# Patient Record
Sex: Male | Born: 2000 | Race: White | Hispanic: No | Marital: Single | Smoking: Never smoker
Health system: Southern US, Community
[De-identification: ages and names within clinical notes are randomized; demographics above are authoritative.]

## PROBLEM LIST (undated history)

## (undated) DIAGNOSIS — Z8489 Family history of other specified conditions: Secondary | ICD-10-CM

## (undated) DIAGNOSIS — E785 Hyperlipidemia, unspecified: Secondary | ICD-10-CM

## (undated) DIAGNOSIS — E119 Type 2 diabetes mellitus without complications: Secondary | ICD-10-CM

## (undated) DIAGNOSIS — E669 Obesity, unspecified: Secondary | ICD-10-CM

## (undated) DIAGNOSIS — R748 Abnormal levels of other serum enzymes: Secondary | ICD-10-CM

## (undated) DIAGNOSIS — I10 Essential (primary) hypertension: Secondary | ICD-10-CM

## (undated) DIAGNOSIS — D649 Anemia, unspecified: Secondary | ICD-10-CM

## (undated) HISTORY — PX: NO PAST SURGERIES: SHX2092

---
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
## 2019-10-08 ENCOUNTER — Other Ambulatory Visit: Payer: Managed Care, Other (non HMO)

## 2021-01-03 ENCOUNTER — Emergency Department: Payer: Self-pay

## 2021-01-03 ENCOUNTER — Emergency Department
Admission: EM | Admit: 2021-01-03 | Discharge: 2021-01-03 | Disposition: A | Discharge status: Home / Self Care | Payer: Self-pay | Attending: Emergency Medicine | Admitting: Emergency Medicine

## 2021-01-03 ENCOUNTER — Other Ambulatory Visit: Payer: Self-pay

## 2021-01-03 DIAGNOSIS — W010XXA Fall on same level from slipping, tripping and stumbling without subsequent striking against object, initial encounter: Secondary | ICD-10-CM | POA: Diagnosis not present

## 2021-01-03 DIAGNOSIS — Y9301 Activity, walking, marching and hiking: Secondary | ICD-10-CM | POA: Diagnosis not present

## 2021-01-03 DIAGNOSIS — M25562 Pain in left knee: Secondary | ICD-10-CM | POA: Insufficient documentation

## 2021-01-03 DIAGNOSIS — Y9289 Other specified places as the place of occurrence of the external cause: Secondary | ICD-10-CM | POA: Diagnosis not present

## 2021-01-03 DIAGNOSIS — I10 Essential (primary) hypertension: Secondary | ICD-10-CM | POA: Insufficient documentation

## 2021-01-03 HISTORY — DX: Essential (primary) hypertension: I10

## 2021-01-03 MED ORDER — KETOROLAC TROMETHAMINE 30 MG/ML IJ SOLN
30.0000 mg | Freq: Once | INTRAMUSCULAR | Status: AC
  Administered 2021-01-03: 30 mg via INTRAMUSCULAR
  Filled 2021-01-03: qty 1

## 2021-01-03 MED ORDER — MELOXICAM 15 MG PO TABS: 15.0000 mg | ORAL_TABLET | Freq: Every day | ORAL | 2 refills | Status: DC

## 2021-01-03 NOTE — ED Triage Notes (Signed)
Pt states he was walking into [employer] and slipped and fell, pt states he felt like his knee popped out but he was able to pop it back in. Pt states he is now having pain, able to ambulate.

## 2021-01-03 NOTE — ED Notes (Signed)
Patient not in room

## 2021-01-03 NOTE — ED Notes (Addendum)
Patient denies pain or swelling at injection site. Patient reports mild improvement to pain. Patient assisted to father's POV via wheelchair by this RN.

## 2021-01-03 NOTE — ED Notes (Signed)
Patient reports talking to a restaurant earlier this evening, tripping, and falling onto his knees. Patient reports "all my weight went onto my left knee, and it popped out. I have had this happen so many times before, I popped it back in before EMS even got there." The patient reports "It's so painful, I can't stand it and I can't put weight on it." Patient provided ice pack by this RN.

## 2021-01-03 NOTE — Discharge Instructions (Signed)
Take Meloxicam once daily for pain and inflammation.  

## 2021-01-03 NOTE — ED Provider Notes (Signed)
[facility]-EMERGENCY DEPARTMENT  ____________________________________________  Time seen: Approximately 9:30 PM  I have reviewed the triage vital signs and the nursing notes.   HISTORY  Chief Complaint Knee Pain   Historian Patient     HPI Todd Ward is a 20 y.o. male presents to the emergency department after his left patella subluxed during a fall.  Patient states that he was able to reduce patella easily.  He states that he is experience subluxed patella in the past and has been coached by physical therapy on how to undergo reduction.  He states that he has had some left knee discomfort but has improved since reduction.  He denies numbness or tingling in the lower extremities.  No abrasions or lacerations.  No other alleviating measures have been attempted.   Past Medical History:  Diagnosis Date  . Family history of adverse reaction to anesthesia    PTS TWIN BROTHER-HARD TIME TO WAKE UP  . Hypertension   . Obesity      Immunizations up to date:  Yes.     Past Medical History:  Diagnosis Date  . Family history of adverse reaction to anesthesia    PTS TWIN BROTHER-HARD TIME TO WAKE UP  . Hypertension   . Obesity     There are no problems to display for this patient.   Past Surgical History:  Procedure Laterality Date  . KNEE ARTHROSCOPY Left 02/16/2016   Procedure: ARTHROSCOPY KNEE, LATERAL RELEASE, LOOSE BODY REMOVAL;  Surgeon: Kennedy Bucker, MD;  Location: [facility] ORS;  Service: Orthopedics;  Laterality: Left;  . NO PAST SURGERIES      Prior to Admission medications   Medication Sig Start Date End Date Taking? Authorizing Provider  meloxicam (MOBIC) 15 MG tablet Take 1 tablet (15 mg total) by mouth daily. 01/03/21 01/03/22 Yes Pia Mau M, PA-C  HYDROcodone-acetaminophen (NORCO) 5-325 MG tablet Take 1 tablet by mouth every 6 (six) hours as needed for moderate pain. 02/16/16   Kennedy Bucker, MD  ibuprofen (ADVIL,MOTRIN) 600 MG tablet Take 600 mg by mouth every  6 (six) hours as needed.    [provider]    Allergies Patient has no known allergies.  No family history on file.  Social History Social History   Tobacco Use  . Smoking status: Never Smoker  . Smokeless tobacco: Never Used  Substance Use Topics  . Alcohol use: No  . Drug use: No     Review of Systems  Constitutional: No fever/chills Eyes:  No discharge ENT: No upper respiratory complaints. Respiratory: no cough. No SOB/ use of accessory muscles to breath Gastrointestinal:   No nausea, no vomiting.  No diarrhea.  No constipation. Musculoskeletal: Patient has left knee pain.  Skin: Negative for rash, abrasions, lacerations, ecchymosis.    ____________________________________________   PHYSICAL EXAM:  VITAL SIGNS: ED Triage Vitals  Enc Vitals Group     BP 01/03/21 2021 134/81     Pulse Rate 01/03/21 2021 98     Resp 01/03/21 2021 20     Temp 01/03/21 2021 98.7 F (37.1 C)     Temp src --      SpO2 01/03/21 2021 97 %     Weight 01/03/21 2019 (!) 490 lb (222.3 kg)     Height 01/03/21 2019 6\' 3"  (1.905 m)     Head Circumference --      Peak Flow --      Pain Score 01/03/21 2019 8     Pain Loc --  Pain Edu? --      Excl. in GC? --      Constitutional: Alert and oriented. Well appearing and in no acute distress. Eyes: Conjunctivae are normal. PERRL. EOMI. Head: Atraumatic. ENT:  Cardiovascular: Normal rate, regular rhythm. Normal S1 and S2.  Good peripheral circulation. Respiratory: Normal respiratory effort without tachypnea or retractions. Lungs CTAB. Good air entry to the bases with no decreased or absent breath sounds Gastrointestinal: Bowel sounds x 4 quadrants. Soft and nontender to palpation. No guarding or rigidity. No distention. Musculoskeletal: Full range of motion to all extremities. No obvious deformities noted Neurologic:  Normal for age. No gross focal neurologic deficits are appreciated.  Skin:  Skin is warm, dry and intact.  No rash noted. Psychiatric: Mood and affect are normal for age. Speech and behavior are normal.   ____________________________________________   LABS (all labs ordered are listed, but only abnormal results are displayed)  Labs Reviewed - No data to display ____________________________________________  EKG   ____________________________________________  RADIOLOGY Geraldo Pitter, personally viewed and evaluated these images (plain radiographs) as part of my medical decision making, as well as reviewing the written report by the radiologist.  DG Knee Complete 4 Views Left  Result Date: 01/03/2021 CLINICAL DATA:  Slipped and fell, transient patellar dislocation EXAM: LEFT KNEE - COMPLETE 4+ VIEW COMPARISON:  None. FINDINGS: Frontal, bilateral oblique, lateral views of the left knee are obtained. There is lateral subluxation of the patella without frank dislocation. Moderate joint effusion. No acute fracture, subluxation, or dislocation. Mild medial and lateral compartmental osteoarthritis. Soft tissues are otherwise unremarkable. IMPRESSION: 1. Lateral subluxation of the patella without frank dislocation. 2. Moderate joint effusion. 3. Mild osteoarthritis. 4. No acute fracture. Electronically Signed   By: Sharlet Salina M.D.   On: 01/03/2021 20:53    ____________________________________________    PROCEDURES  Procedure(s) performed:     Procedures     Medications  ketorolac (TORADOL) 30 MG/ML injection 30 mg (has no administration in time range)     ____________________________________________   INITIAL IMPRESSION / ASSESSMENT AND PLAN / ED COURSE  Pertinent labs & imaging results that were available during my care of the patient were reviewed by me and considered in my medical decision making (see chart for details).      Assessment and plan Knee pain:  20 year old male presents to the emergency department with acute left knee pain after mechanical fall that  occurred earlier in the day.  X-ray of the left knee showed a subluxed patella without dislocation.  Patient was advised to follow-up with orthopedics.  He was given an injection of Toradol for pain and was discharged with meloxicam.     ____________________________________________  FINAL CLINICAL IMPRESSION(S) / ED DIAGNOSES  Final diagnoses:  Acute pain of left knee      NEW MEDICATIONS STARTED DURING THIS VISIT:  ED Discharge Orders         Ordered    meloxicam (MOBIC) 15 MG tablet  Daily        01/03/21 2113              This chart was dictated using voice recognition software/Dragon. Despite best efforts to proofread, errors can occur which can change the meaning. Any change was purely unintentional.     Gasper Lloyd 01/03/21 2136    Minna Antis, MD 01/03/21 [phone]

## 2021-01-03 NOTE — ED Notes (Signed)
Patient transported to X-ray 

## 2021-01-17 ENCOUNTER — Other Ambulatory Visit: Payer: Self-pay | Admitting: Orthopedic Surgery

## 2021-01-24 ENCOUNTER — Encounter: Payer: Self-pay | Admitting: Orthopedic Surgery

## 2021-01-24 ENCOUNTER — Encounter
Admission: RE | Admit: 2021-01-24 | Discharge: 2021-01-24 | Disposition: A | Discharge status: Home / Self Care | Payer: No Typology Code available for payment source | Source: Ambulatory Visit | Attending: Orthopedic Surgery | Admitting: Orthopedic Surgery

## 2021-01-24 ENCOUNTER — Other Ambulatory Visit: Payer: Self-pay

## 2021-01-24 DIAGNOSIS — E119 Type 2 diabetes mellitus without complications: Secondary | ICD-10-CM | POA: Diagnosis not present

## 2021-01-24 DIAGNOSIS — I10 Essential (primary) hypertension: Secondary | ICD-10-CM | POA: Diagnosis not present

## 2021-01-24 DIAGNOSIS — Z01818 Encounter for other preprocedural examination: Secondary | ICD-10-CM | POA: Insufficient documentation

## 2021-01-24 DIAGNOSIS — U071 COVID-19: Secondary | ICD-10-CM | POA: Insufficient documentation

## 2021-01-24 HISTORY — DX: Anemia, unspecified: D64.9

## 2021-01-24 HISTORY — DX: Abnormal levels of other serum enzymes: R74.8

## 2021-01-24 HISTORY — DX: Morbid (severe) obesity due to excess calories: E66.01

## 2021-01-24 HISTORY — DX: Hyperlipidemia, unspecified: E78.5

## 2021-01-24 NOTE — Patient Instructions (Addendum)
Your procedure is scheduled on:01-29-21 MONDAY Report to the Registration Desk on the 1st floor of the [facility]-Then proceed to the 2nd floor Surgery Desk in the [facility] To find out your arrival time, please call [phone] between 1PM - 3PM on:01-26-21 FRIDAY  REMEMBER: Instructions that are not followed completely may result in serious medical risk, up to and including death; or upon the discretion of your surgeon and anesthesiologist your surgery may need to be rescheduled.  Do not eat food after midnight the night before surgery.  No gum chewing, lozengers or hard candies.  You may however, drink CLEAR liquids up to 2 hours before you are scheduled to arrive for your surgery. Do not drink anything within 2 hours of your scheduled arrival time.  Clear liquids include: - water  - apple juice without pulp - gatorade - black coffee or tea (Do NOT add milk or creamers to the coffee or tea) Do NOT drink anything that is not on this list.  In addition, your doctor has ordered for you to drink the provided  Ensure Pre-Surgery Clear Carbohydrate Drink  Drinking this carbohydrate drink up to two hours before surgery helps to reduce insulin resistance and improve patient outcomes. Please complete drinking 2 hours prior to scheduled arrival time.  TAKE THESE MEDICATIONS THE MORNING OF SURGERY WITH A SIP OF WATER: -YOU MAY TAKE TRAMADOL (ULTRAM) THE DAY OF SURGERY IF NEEDED  One week prior to surgery: Stop Anti-inflammatories (NSAIDS) such as MOBIC (MELOXICAM), Advil, Aleve, Ibuprofen, Motrin, Naproxen, Naprosyn and Aspirin based products such as Excedrin, Goodys Powder, BC Powder-OK TO TAKE TYLENOL/TRAMADOL IF NEEDED  Stop ANY OVER THE COUNTER supplements until after surgery.  No Alcohol for 24 hours before or after surgery.  No Smoking including e-cigarettes for 24 hours prior to surgery.  No chewable tobacco products for at least 6 hours prior to surgery.  No nicotine patches  on the day of surgery.  Do not use any "recreational" drugs for at least a week prior to your surgery.  Please be advised that the combination of cocaine and anesthesia may have negative outcomes, up to and including death. If you test positive for cocaine, your surgery will be cancelled.  On the morning of surgery brush your teeth with toothpaste and water, you may rinse your mouth with mouthwash if you wish. Do not swallow any toothpaste or mouthwash.  Do not wear jewelry, make-up, hairpins, clips or nail polish.  Do not wear lotions, powders, or perfumes.   Do not shave body from the neck down 48 hours prior to surgery just in case you cut yourself which could leave a site for infection.  Also, freshly shaved skin may become irritated if using the CHG soap.  Contact lenses, hearing aids and dentures may not be worn into surgery.  Do not bring valuables to the hospital. [facility] is not responsible for any missing/lost belongings or valuables.   Use CHG Soap as directed on instruction sheet.  Notify your doctor if there is any change in your medical condition (cold, fever, infection).  Wear comfortable clothing (specific to your surgery type) to the hospital.  Plan for stool softeners for home use; pain medications have a tendency to cause constipation. You can also help prevent constipation by eating foods high in fiber such as fruits and vegetables and drinking plenty of fluids as your diet allows.  After surgery, you can help prevent lung complications by doing breathing exercises.  Take deep breaths  and cough every 1-2 hours. Your doctor may order a device called an Incentive Spirometer to help you take deep breaths. When coughing or sneezing, hold a pillow firmly against your incision with both hands. This is called "splinting." Doing this helps protect your incision. It also decreases belly discomfort.  If you are being admitted to the hospital overnight, leave your  suitcase in the car. After surgery it may be brought to your room.  If you are being discharged the day of surgery, you will not be allowed to drive home. You will need a responsible adult (18 years or older) to drive you home and stay with you that night.   If you are taking public transportation, you will need to have a responsible adult (18 years or older) with you. Please confirm with your physician that it is acceptable to use public transportation.   Please call the Pre-admissions Testing Dept. at [phone] if you have any questions about these instructions.  Surgery Visitation Policy:  Patients undergoing a surgery or procedure may have one family member or support person with them as long as that person is not COVID-19 positive or experiencing its symptoms.  That person may remain in the waiting area during the procedure.  Inpatient Visitation:    Visiting hours are 7 a.m. to 8 p.m. Inpatients will be allowed two visitors daily. The visitors may change each day during the patient's stay. No visitors under the age of 75. Any visitor under the age of 16 must be accompanied by an adult. The visitor must pass COVID-19 screenings, use hand sanitizer when entering and exiting the patient's room and wear a mask at all times, including in the patient's room. Patients must also wear a mask when staff or their visitor are in the room. Masking is required regardless of vaccination status.

## 2021-01-26 ENCOUNTER — Other Ambulatory Visit: Payer: Self-pay

## 2021-01-26 ENCOUNTER — Other Ambulatory Visit: Payer: No Typology Code available for payment source

## 2021-01-26 ENCOUNTER — Encounter
Admission: RE | Admit: 2021-01-26 | Discharge: 2021-01-26 | Disposition: A | Discharge status: Home / Self Care | Payer: No Typology Code available for payment source | Source: Ambulatory Visit | Attending: Orthopedic Surgery | Admitting: Orthopedic Surgery

## 2021-01-26 DIAGNOSIS — Z01818 Encounter for other preprocedural examination: Secondary | ICD-10-CM | POA: Insufficient documentation

## 2021-01-26 DIAGNOSIS — U071 COVID-19: Secondary | ICD-10-CM | POA: Insufficient documentation

## 2021-01-26 DIAGNOSIS — E119 Type 2 diabetes mellitus without complications: Secondary | ICD-10-CM | POA: Insufficient documentation

## 2021-01-26 DIAGNOSIS — I10 Essential (primary) hypertension: Secondary | ICD-10-CM | POA: Insufficient documentation

## 2021-01-26 LAB — COMPREHENSIVE METABOLIC PANEL
ALT: 84 U/L — ABNORMAL HIGH (ref 0–44)
AST: 53 U/L — ABNORMAL HIGH (ref 15–41)
Albumin: 4.4 g/dL (ref 3.5–5.0)
Alkaline Phosphatase: 98 U/L (ref 38–126)
Anion gap: 12 (ref 5–15)
BUN: 11 mg/dL (ref 6–20)
CO2: 21 mmol/L — ABNORMAL LOW (ref 22–32)
Calcium: 9.5 mg/dL (ref 8.9–10.3)
Chloride: 106 mmol/L (ref 98–111)
Creatinine, Ser: 0.8 mg/dL (ref 0.61–1.24)
GFR, Estimated: >60 mL/min (ref >60)
Glucose, Bld: 96 mg/dL (ref 70–99)
Potassium: 3.9 mmol/L (ref 3.5–5.1)
Sodium: 139 mmol/L (ref 135–145)
Total Bilirubin: 0.8 mg/dL (ref 0.3–1.2)
Total Protein: 7.8 g/dL (ref 6.5–8.1)

## 2021-01-26 LAB — CBC
HCT: 45.4 % (ref 39.0–52.0)
Hemoglobin: 15.4 g/dL (ref 13.0–17.0)
MCH: 27.5 pg (ref 26.0–34.0)
MCHC: 33.9 g/dL (ref 30.0–36.0)
MCV: 80.9 fL (ref 80.0–100.0)
Platelets: 240 10*3/uL (ref 150–400)
RBC: 5.61 MIL/uL (ref 4.22–5.81)
RDW: 13.3 % (ref 11.5–15.5)
WBC: 6.8 10*3/uL (ref 4.0–10.5)
nRBC: 0 % (ref 0.0–0.2)

## 2021-01-27 LAB — SARS CORONAVIRUS 2 (TAT 6-24 HRS): SARS Coronavirus 2: POSITIVE — AB

## 2021-01-28 ENCOUNTER — Telehealth: Payer: Self-pay | Admitting: Physician Assistant

## 2021-01-28 NOTE — Telephone Encounter (Signed)
Called to discuss with Lynnae January about Covid symptoms and the use of bebtelivomab, remdisivir or oral therapies for those with mild to moderate Covid symptoms and at a high risk of hospitalization.     Pt does not qualify as pt has asymptomatic infection. Isolation precautions discussed. Advised to contact back for consideration should they develop symptoms. Patient verbalized understanding. Pts RFs are DM and morbid obesity. He was tested for screening for a knee surgery. He has been vaccinated and boosted. He will call our hotline if he goes on to develop symptoms.     Cline Crock PA-C

## 2021-01-29 ENCOUNTER — Other Ambulatory Visit: Payer: Self-pay

## 2021-01-29 ENCOUNTER — Other Ambulatory Visit: Payer: Self-pay | Admitting: Adult Health

## 2021-01-29 ENCOUNTER — Telehealth: Payer: Self-pay

## 2021-01-29 DIAGNOSIS — U071 COVID-19: Secondary | ICD-10-CM

## 2021-01-29 MED ORDER — ALBUTEROL SULFATE HFA 108 (90 BASE) MCG/ACT IN AERS
2.0000 | INHALATION_SPRAY | Freq: Four times a day (QID) | RESPIRATORY_TRACT | 0 refills | Status: DC | PRN
  Filled 2021-01-29: qty 8.5, 25d supply, fill #0

## 2021-01-29 MED ORDER — NIRMATRELVIR/RITONAVIR (PAXLOVID)TABLET
3.0000 | ORAL_TABLET | Freq: Two times a day (BID) | ORAL | 0 refills | Status: AC
  Filled 2021-01-29: qty 30, 5d supply, fill #0

## 2021-01-29 NOTE — Progress Notes (Signed)
Outpatient Oral COVID Treatment Note  I connected with Todd Ward on 01/29/2021/3:36 PM by telephone and verified that I am speaking with the correct person using two identifiers.  I discussed the limitations, risks, security, and privacy concerns of performing an evaluation and management service by telephone and the availability of in person appointments. I also discussed with the patient that there may be a patient responsible charge related to this service. The patient expressed understanding and agreed to proceed.  Patient location: home Provider location: home Others participating in call: none  Diagnosis: COVID-19 infection  Purpose of visit: Discussion of potential use of Molnupiravir or Paxlovid, a new treatment for mild to moderate COVID-19 viral infection in non-hospitalized patients.   Subjective: Patient is a 20 y.o. male who has been diagnosed with COVID 19 viral infection.  Their symptoms began on 01/27/21 with sore throat.    Past Medical History:  Diagnosis Date  . Diabetes mellitus without complication (HCC)    No meds-Last A1C in 2020 was 5.4  . Elevated liver enzymes   . Family history of adverse reaction to anesthesia    PTS TWIN BROTHER-HARD TIME TO WAKE UP  . Hyperlipidemia   . Hypertension    off meds x 3-4 years  . Morbid obesity (HCC)   . Normocytic normochromic anemia   . Obesity     No Known Allergies   Current Outpatient Medications:  .  meloxicam (MOBIC) 15 MG tablet, Take 1 tablet (15 mg total) by mouth daily., Disp: 30 tablet, Rfl: 2 .  traMADol (ULTRAM) 50 MG tablet, Take 50 mg by mouth every morning., Disp: , Rfl:   Objective: Patient appears/sounds tired and ill, he was just waking up when I spoke with him.  They are in no apparent distress.  Breathing is non labored.  Mood and behavior are normal.  Laboratory Data:  Recent Results (from the past 2160 hour(s))  Comprehensive metabolic panel     Status: Abnormal   Collection Time: 01/26/21   1:21 PM  Result Value Ref Range   Sodium 139 135 - 145 mmol/L   Potassium 3.9 3.5 - 5.1 mmol/L   Chloride 106 98 - 111 mmol/L   CO2 21 (L) 22 - 32 mmol/L   Glucose, Bld 96 70 - 99 mg/dL    Comment: Glucose reference range applies only to samples taken after fasting for at least 8 hours.   BUN 11 6 - 20 mg/dL   Creatinine, Ser 8.67 0.61 - 1.24 mg/dL   Calcium 9.5 8.9 - 61.9 mg/dL   Total Protein 7.8 6.5 - 8.1 g/dL   Albumin 4.4 3.5 - 5.0 g/dL   AST 53 (H) 15 - 41 U/L   ALT 84 (H) 0 - 44 U/L   Alkaline Phosphatase 98 38 - 126 U/L   Total Bilirubin 0.8 0.3 - 1.2 mg/dL   GFR, Estimated >50 >93 mL/min    Comment: (NOTE) Calculated using the CKD-EPI Creatinine Equation (2021)    Anion gap 12 5 - 15    Comment: Performed at [facility], [address] [place], [place] [address]  CBC     Status: None   Collection Time: 01/26/21  1:21 PM  Result Value Ref Range   WBC 6.8 4.0 - 10.5 K/uL   RBC 5.61 4.22 - 5.81 MIL/uL   Hemoglobin 15.4 13.0 - 17.0 g/dL   HCT 45.8 09.9 - 83.3 %   MCV 80.9 80.0 - 100.0 fL   MCH  27.5 26.0 - 34.0 pg   MCHC 33.9 30.0 - 36.0 g/dL   RDW 32.3 55.7 - 32.2 %   Platelets 240 150 - 400 K/uL   nRBC 0.0 0.0 - 0.2 %    Comment: Performed at [facility], [address] [place], [place] [address]  SARS CORONAVIRUS 2 (TAT 6-24 HRS) Nasopharyngeal Nasopharyngeal Swab     Status: Abnormal   Collection Time: 01/26/21  1:53 PM   Specimen: Nasopharyngeal Swab  Result Value Ref Range   SARS Coronavirus 2 POSITIVE (A) NEGATIVE    Comment: (NOTE) SARS-CoV-2 target nucleic acids are DETECTED.  The SARS-CoV-2 RNA is generally detectable in upper and lower respiratory specimens during the acute phase of infection. Positive results are indicative of the presence of SARS-CoV-2 RNA. Clinical correlation with patient history and other diagnostic information is  necessary to determine patient infection status. Positive results do not rule out bacterial  infection or co-infection with other viruses.  The expected result is Negative.  Fact Sheet for Patients: HairSlick.no  Fact Sheet for Healthcare Providers: quierodirigir.com  This test is not yet approved or cleared by the [place] FDA and  has been authorized for detection and/or diagnosis of SARS-CoV-2 by FDA under an Emergency Use Authorization (EUA). This EUA will remain  in effect (meaning this test can be used) for the duration of the COVID-19 declaration under Section 564(b)(1) of the Act, 21 U. S.C. section 360bbb-3(b)(1), unless the authorization is terminated or revoked sooner.   Performed at [facility], [address], [place], [place] [address]      Assessment: 19 y.o. male with mild/moderate COVID 19 viral infection diagnosed on 01/26/21 at high risk for progression to severe COVID 19.  Plan:  This patient is a 20 y.o. male that meets the following criteria for Emergency Use Authorization of: Paxlovid 1. Age >12 yr AND > 40 kg 2. SARS-COV-2 positive test 3. Symptom onset < 5 days 4. Mild-to-moderate COVID disease with high risk for severe progression to hospitalization or death  I have spoken and communicated the following to the patient or parent/caregiver regarding: 1. Paxlovid is an unapproved drug that is authorized for use under an Emergency Use Authorization.  2. There are no adequate, approved, available products for the treatment of COVID-19 in adults who have mild-to-moderate COVID-19 and are at high risk for progressing to severe COVID-19, including hospitalization or death. 3. Other therapeutics are currently authorized. For additional information on all products authorized for treatment or prevention of COVID-19, please see https://www.graham-miller.com/.  4. There are benefits and risks of taking  this treatment as outlined in the "Fact Sheet for Patients and Caregivers."  5. "Fact Sheet for Patients and Caregivers" was reviewed with patient. A hard copy will be provided to patient from pharmacy prior to the patient receiving treatment. 6. Patients should continue to self-isolate and use infection control measures (e.g., wear mask, isolate, social distance, avoid sharing personal items, clean and disinfect "high touch" surfaces, and frequent handwashing) according to CDC guidelines.  7. The patient or parent/caregiver has the option to accept or refuse treatment. 8. Patient medication history was reviewed for potential drug interactions:Interaction with home meds: Tramadol--I recommended that he hold tramadol while taking the paxlovid, since the paxlovid can intensify its effect 9. Patient's GFR was calculated to be 145, and they were therefore prescribed Normal dose (GFR>60) - nirmatrelvir 150mg  tab (2 tablet) by mouth twice daily AND ritonavir 100mg  tab (1 tablet) by mouth  twice daily   After reviewing above information with the patient, the patient agrees to receive Paxlovid.  I also sent in Albuterol inhaler as he had a mild cough that recently developed.    Follow up instructions:    . Take prescription BID x 5 days as directed . Reach out to pharmacist for counseling on medication if desired . For concerns regarding further COVID symptoms please follow up with your PCP or urgent care . For urgent or life-threatening issues, seek care at your local emergency department  The patient was provided an opportunity to ask questions, and all were answered. The patient agreed with the plan and demonstrated an understanding of the instructions.   Script sent to [facility] and opted to pick up RX.  The patient was advised to call their PCP or seek an in-person evaluation if the symptoms worsen or if the condition fails to improve as anticipated.   I provided 15 minutes of non  face-to-face telephone visit time during this encounter, and > 50% was spent counseling as documented under my assessment & plan.  Noreene Filbert, NP 01/29/2021 /3:36 PM

## 2021-01-29 NOTE — Telephone Encounter (Signed)
Patient was prescribed oral covid treatment Paxlovid and treatment note was reviewed. Medication has been received by [facility] and reviewed for appropriateness.  Drug Interactions or Dosage Adjustments Noted: Patient was instructed to stop tramadol while taking Paxlovid.  Delivery Method: Pick-up  Patient contacted for counseling on 01/29/21 and verbalized understanding.   Delivery or Pick-Up Date: 01/29/21   Marney Doctor 01/29/2021, 4:39 PM [facility] Outpatient Pharmacist Phone# [phone]

## 2021-01-31 ENCOUNTER — Other Ambulatory Visit: Payer: Self-pay | Admitting: Orthopedic Surgery

## 2021-01-31 ENCOUNTER — Other Ambulatory Visit: Payer: Self-pay

## 2021-02-19 ENCOUNTER — Encounter: Payer: Self-pay | Admitting: Orthopedic Surgery

## 2021-02-19 ENCOUNTER — Ambulatory Visit: Payer: PRIVATE HEALTH INSURANCE | Admitting: Urgent Care

## 2021-02-19 ENCOUNTER — Ambulatory Visit: Payer: PRIVATE HEALTH INSURANCE

## 2021-02-19 ENCOUNTER — Encounter
Admission: RE | Disposition: A | Discharge status: Home / Self Care | Payer: Self-pay | Source: Home / Self Care | Attending: Orthopedic Surgery

## 2021-02-19 ENCOUNTER — Observation Stay
Admission: RE | Admit: 2021-02-19 | Discharge: 2021-02-22 | Disposition: A | Discharge status: Home / Self Care | Payer: PRIVATE HEALTH INSURANCE | Attending: Orthopedic Surgery | Admitting: Orthopedic Surgery

## 2021-02-19 ENCOUNTER — Other Ambulatory Visit: Payer: Self-pay

## 2021-02-19 ENCOUNTER — Ambulatory Visit: Payer: PRIVATE HEALTH INSURANCE | Admitting: Anesthesiology

## 2021-02-19 DIAGNOSIS — S8332XA Tear of articular cartilage of left knee, current, initial encounter: Secondary | ICD-10-CM | POA: Diagnosis not present

## 2021-02-19 DIAGNOSIS — Z419 Encounter for procedure for purposes other than remedying health state, unspecified: Secondary | ICD-10-CM

## 2021-02-19 DIAGNOSIS — M222X2 Patellofemoral disorders, left knee: Secondary | ICD-10-CM | POA: Diagnosis not present

## 2021-02-19 DIAGNOSIS — X58XXXA Exposure to other specified factors, initial encounter: Secondary | ICD-10-CM | POA: Diagnosis not present

## 2021-02-19 DIAGNOSIS — I10 Essential (primary) hypertension: Secondary | ICD-10-CM | POA: Diagnosis not present

## 2021-02-19 DIAGNOSIS — E119 Type 2 diabetes mellitus without complications: Secondary | ICD-10-CM | POA: Diagnosis not present

## 2021-02-19 DIAGNOSIS — M2202 Recurrent dislocation of patella, left knee: Secondary | ICD-10-CM | POA: Diagnosis present

## 2021-02-19 DIAGNOSIS — S83006A Unspecified dislocation of unspecified patella, initial encounter: Secondary | ICD-10-CM | POA: Diagnosis present

## 2021-02-19 HISTORY — DX: Type 2 diabetes mellitus without complications: E11.9

## 2021-02-19 HISTORY — PX: KNEE ARTHROSCOPY WITH MEDIAL PATELLAR FEMORAL LIGAMENT RECONSTRUCTION: SHX5652

## 2021-02-19 LAB — GLUCOSE, CAPILLARY
Glucose-Capillary: 105 mg/dL — ABNORMAL HIGH (ref 70–99)
Glucose-Capillary: 120 mg/dL — ABNORMAL HIGH (ref 70–99)

## 2021-02-19 SURGERY — REPAIR, TENDON, PATELLAR, ARTHROSCOPIC
Anesthesia: General | Site: Knee | Laterality: Left

## 2021-02-19 MED ORDER — ACETAMINOPHEN 10 MG/ML IV SOLN: 1000.0000 mg | Freq: Once | INTRAVENOUS | Status: DC | PRN

## 2021-02-19 MED ORDER — METHOCARBAMOL 1000 MG/10ML IJ SOLN
500.0000 mg | Freq: Four times a day (QID) | INTRAVENOUS | Status: DC | PRN
  Filled 2021-02-19: qty 5

## 2021-02-19 MED ORDER — FENTANYL CITRATE (PF) 100 MCG/2ML IJ SOLN
INTRAMUSCULAR | Status: AC
  Filled 2021-02-19: qty 2

## 2021-02-19 MED ORDER — SEVOFLURANE IN SOLN
RESPIRATORY_TRACT | Status: AC
  Filled 2021-02-19: qty 250

## 2021-02-19 MED ORDER — OXYCODONE HCL 5 MG PO TABS: 5.0000 mg | ORAL_TABLET | Freq: Once | ORAL | Status: DC | PRN

## 2021-02-19 MED ORDER — BUPIVACAINE LIPOSOME 1.3 % IJ SUSP
INTRAMUSCULAR | Status: DC | PRN
  Administered 2021-02-19: 20 mL

## 2021-02-19 MED ORDER — BUPIVACAINE LIPOSOME 1.3 % IJ SUSP
INTRAMUSCULAR | Status: AC
  Filled 2021-02-19: qty 20

## 2021-02-19 MED ORDER — ACETAMINOPHEN 10 MG/ML IV SOLN
INTRAVENOUS | Status: AC
  Filled 2021-02-19: qty 100

## 2021-02-19 MED ORDER — PROPOFOL 10 MG/ML IV BOLUS
INTRAVENOUS | Status: AC
  Filled 2021-02-19: qty 20

## 2021-02-19 MED ORDER — SUCCINYLCHOLINE CHLORIDE 20 MG/ML IJ SOLN
INTRAMUSCULAR | Status: DC | PRN
  Administered 2021-02-19: 200 mg via INTRAVENOUS

## 2021-02-19 MED ORDER — DEXAMETHASONE SODIUM PHOSPHATE 10 MG/ML IJ SOLN
INTRAMUSCULAR | Status: DC | PRN
  Administered 2021-02-19: 10 mg via INTRAVENOUS

## 2021-02-19 MED ORDER — ONDANSETRON HCL 4 MG/2ML IJ SOLN: 4.0000 mg | Freq: Four times a day (QID) | INTRAMUSCULAR | Status: DC | PRN

## 2021-02-19 MED ORDER — BUPIVACAINE HCL 0.5 % IJ SOLN
INTRAMUSCULAR | Status: DC | PRN
  Administered 2021-02-19: 20 mL

## 2021-02-19 MED ORDER — ONDANSETRON HCL 4 MG PO TABS: 4.0000 mg | ORAL_TABLET | Freq: Four times a day (QID) | ORAL | Status: DC | PRN

## 2021-02-19 MED ORDER — FAMOTIDINE 20 MG PO TABS
ORAL_TABLET | ORAL | Status: AC
  Administered 2021-02-19: 20 mg via ORAL
  Filled 2021-02-19: qty 1

## 2021-02-19 MED ORDER — BUPIVACAINE HCL (PF) 0.5 % IJ SOLN
INTRAMUSCULAR | Status: AC
  Filled 2021-02-19: qty 30

## 2021-02-19 MED ORDER — HYDROMORPHONE HCL 1 MG/ML IJ SOLN
0.5000 mg | INTRAMUSCULAR | Status: DC | PRN
  Administered 2021-02-19 – 2021-02-21 (×3): 1 mg via INTRAVENOUS
  Filled 2021-02-19 (×3): qty 1

## 2021-02-19 MED ORDER — SODIUM CHLORIDE 0.9 % IV SOLN: INTRAVENOUS | Status: DC

## 2021-02-19 MED ORDER — LACTATED RINGERS IV SOLN: INTRAVENOUS | Status: DC | PRN

## 2021-02-19 MED ORDER — ACETAMINOPHEN 500 MG PO TABS
1000.0000 mg | ORAL_TABLET | Freq: Four times a day (QID) | ORAL | Status: AC
  Administered 2021-02-19 – 2021-02-20 (×4): 1000 mg via ORAL
  Filled 2021-02-19 (×4): qty 2

## 2021-02-19 MED ORDER — CHLORHEXIDINE GLUCONATE 0.12 % MT SOLN
OROMUCOSAL | Status: AC
  Administered 2021-02-19: 15 mL via OROMUCOSAL
  Filled 2021-02-19: qty 15

## 2021-02-19 MED ORDER — ROCURONIUM BROMIDE 100 MG/10ML IV SOLN
INTRAVENOUS | Status: DC | PRN
  Administered 2021-02-19 (×4): 20 mg via INTRAVENOUS

## 2021-02-19 MED ORDER — SUGAMMADEX SODIUM 200 MG/2ML IV SOLN
INTRAVENOUS | Status: DC | PRN
  Administered 2021-02-19: 500 mg via INTRAVENOUS

## 2021-02-19 MED ORDER — HYDROMORPHONE HCL 1 MG/ML IJ SOLN
INTRAMUSCULAR | Status: DC | PRN
  Administered 2021-02-19 (×2): .5 mg via INTRAVENOUS

## 2021-02-19 MED ORDER — ORAL CARE MOUTH RINSE: 15.0000 mL | Freq: Once | OROMUCOSAL | Status: AC

## 2021-02-19 MED ORDER — CEFAZOLIN IN SODIUM CHLORIDE 3-0.9 GM/100ML-% IV SOLN
3.0000 g | Freq: Four times a day (QID) | INTRAVENOUS | Status: AC
  Administered 2021-02-19 – 2021-02-20 (×3): 3 g via INTRAVENOUS
  Filled 2021-02-19 (×3): qty 100

## 2021-02-19 MED ORDER — PROPOFOL 10 MG/ML IV BOLUS
INTRAVENOUS | Status: DC | PRN
  Administered 2021-02-19: 20 mg via INTRAVENOUS

## 2021-02-19 MED ORDER — FENTANYL CITRATE (PF) 100 MCG/2ML IJ SOLN
INTRAMUSCULAR | Status: DC | PRN
  Administered 2021-02-19 (×2): 50 ug via INTRAVENOUS
  Administered 2021-02-19: 100 ug via INTRAVENOUS

## 2021-02-19 MED ORDER — OXYCODONE HCL 5 MG PO TABS
5.0000 mg | ORAL_TABLET | ORAL | Status: DC | PRN
Start: 2021-02-19 — End: 2021-02-22
  Administered 2021-02-19 – 2021-02-21 (×6): 10 mg via ORAL
  Filled 2021-02-19 (×7): qty 2

## 2021-02-19 MED ORDER — ACETAMINOPHEN 10 MG/ML IV SOLN
INTRAVENOUS | Status: DC | PRN
  Administered 2021-02-19: 1000 mg via INTRAVENOUS

## 2021-02-19 MED ORDER — NEOMYCIN-POLYMYXIN B GU 40-200000 IR SOLN
Status: AC
  Filled 2021-02-19: qty 1

## 2021-02-19 MED ORDER — SODIUM CHLORIDE FLUSH 0.9 % IV SOLN
INTRAVENOUS | Status: AC
  Filled 2021-02-19: qty 40

## 2021-02-19 MED ORDER — FENTANYL CITRATE (PF) 100 MCG/2ML IJ SOLN
INTRAMUSCULAR | Status: AC
  Administered 2021-02-19: 25 ug via INTRAVENOUS
  Filled 2021-02-19: qty 2

## 2021-02-19 MED ORDER — OXYCODONE HCL 5 MG/5ML PO SOLN: 5.0000 mg | Freq: Once | ORAL | Status: DC | PRN

## 2021-02-19 MED ORDER — METHOCARBAMOL 500 MG PO TABS
500.0000 mg | ORAL_TABLET | Freq: Four times a day (QID) | ORAL | Status: DC | PRN
  Administered 2021-02-20 – 2021-02-22 (×6): 500 mg via ORAL
  Filled 2021-02-19 (×6): qty 1

## 2021-02-19 MED ORDER — BUPIVACAINE-EPINEPHRINE (PF) 0.5% -1:200000 IJ SOLN
INTRAMUSCULAR | Status: AC
  Filled 2021-02-19: qty 30

## 2021-02-19 MED ORDER — CEFAZOLIN IN SODIUM CHLORIDE 3-0.9 GM/100ML-% IV SOLN
3.0000 g | INTRAVENOUS | Status: DC
  Filled 2021-02-19: qty 100

## 2021-02-19 MED ORDER — LIDOCAINE-EPINEPHRINE 1 %-1:100000 IJ SOLN
INTRAMUSCULAR | Status: AC
  Filled 2021-02-19: qty 1

## 2021-02-19 MED ORDER — CHLORHEXIDINE GLUCONATE 0.12 % MT SOLN: 15.0000 mL | Freq: Once | OROMUCOSAL | Status: AC

## 2021-02-19 MED ORDER — DEXTROSE 5 % IV SOLN
INTRAVENOUS | Status: DC | PRN
  Administered 2021-02-19: 3 g via INTRAVENOUS

## 2021-02-19 MED ORDER — ALBUTEROL SULFATE HFA 108 (90 BASE) MCG/ACT IN AERS
2.0000 | INHALATION_SPRAY | Freq: Four times a day (QID) | RESPIRATORY_TRACT | Status: DC | PRN
  Filled 2021-02-19: qty 6.7

## 2021-02-19 MED ORDER — TRAMADOL HCL 50 MG PO TABS
50.0000 mg | ORAL_TABLET | Freq: Four times a day (QID) | ORAL | Status: DC | PRN
  Administered 2021-02-19 – 2021-02-22 (×6): 50 mg via ORAL
  Filled 2021-02-19 (×6): qty 1

## 2021-02-19 MED ORDER — DEXMEDETOMIDINE (PRECEDEX) IN NS 20 MCG/5ML (4 MCG/ML) IV SYRINGE
PREFILLED_SYRINGE | INTRAVENOUS | Status: DC | PRN
  Administered 2021-02-19: 12 ug via INTRAVENOUS

## 2021-02-19 MED ORDER — FENTANYL CITRATE (PF) 100 MCG/2ML IJ SOLN
25.0000 ug | INTRAMUSCULAR | Status: DC | PRN
  Administered 2021-02-19 (×2): 25 ug via INTRAVENOUS
  Administered 2021-02-19: 50 ug via INTRAVENOUS

## 2021-02-19 MED ORDER — SODIUM CHLORIDE 0.9 % IV SOLN: Freq: Once | INTRAVENOUS | Status: AC

## 2021-02-19 MED ORDER — SENNOSIDES-DOCUSATE SODIUM 8.6-50 MG PO TABS
1.0000 | ORAL_TABLET | Freq: Every evening | ORAL | Status: DC | PRN
  Administered 2021-02-22: 1 via ORAL
  Filled 2021-02-19: qty 1

## 2021-02-19 MED ORDER — MIDAZOLAM HCL 2 MG/2ML IJ SOLN
INTRAMUSCULAR | Status: AC
  Filled 2021-02-19: qty 2

## 2021-02-19 MED ORDER — ONDANSETRON HCL 4 MG/2ML IJ SOLN
INTRAMUSCULAR | Status: DC | PRN
  Administered 2021-02-19: 4 mg via INTRAVENOUS

## 2021-02-19 MED ORDER — OXYCODONE HCL 5 MG PO TABS
10.0000 mg | ORAL_TABLET | ORAL | Status: DC | PRN
  Administered 2021-02-20 – 2021-02-21 (×4): 10 mg via ORAL
  Filled 2021-02-19 (×2): qty 2
  Filled 2021-02-19: qty 3

## 2021-02-19 MED ORDER — ONDANSETRON HCL 4 MG/2ML IJ SOLN: 4.0000 mg | Freq: Once | INTRAMUSCULAR | Status: DC | PRN

## 2021-02-19 MED ORDER — DOCUSATE SODIUM 100 MG PO CAPS
100.0000 mg | ORAL_CAPSULE | Freq: Two times a day (BID) | ORAL | Status: DC
  Administered 2021-02-19 – 2021-02-22 (×5): 100 mg via ORAL
  Filled 2021-02-19 (×7): qty 1

## 2021-02-19 MED ORDER — ASPIRIN EC 325 MG PO TBEC
325.0000 mg | DELAYED_RELEASE_TABLET | Freq: Every day | ORAL | Status: DC
  Administered 2021-02-20 – 2021-02-22 (×3): 325 mg via ORAL
  Filled 2021-02-19 (×3): qty 1

## 2021-02-19 MED ORDER — FAMOTIDINE 20 MG PO TABS: 20.0000 mg | ORAL_TABLET | Freq: Once | ORAL | Status: AC

## 2021-02-19 MED ORDER — LACTATED RINGERS IV SOLN: INTRAVENOUS | Status: DC

## 2021-02-19 MED ORDER — DIPHENHYDRAMINE HCL 12.5 MG/5ML PO ELIX: 12.5000 mg | ORAL_SOLUTION | ORAL | Status: DC | PRN

## 2021-02-19 SURGICAL SUPPLY — 92 items
ADAPTER IRRIG TUBE 2 SPIKE SOL (ADAPTER) ×2 IMPLANT
ANCHOR ALL-SUT Q-FIX 1.8 BLUE (Anchor) ×4 IMPLANT
ANCHOR ALL-SUT Q-FIX 2.8 (Anchor) ×4 IMPLANT
BASIN GRAD PLASTIC 32OZ STRL (MISCELLANEOUS) ×2 IMPLANT
BIT DRILL Q COUPLING 4.5 (BIT) ×2 IMPLANT
BIT DRILL Q/COUPLING 1 (BIT) ×2 IMPLANT
BLADE BOVIE TIP EXT 4 (BLADE) IMPLANT
BLADE OSCILLATING/SAGITTAL (BLADE) ×1
BLADE SAGITTAL AGGR TOOTH XLG (BLADE) ×2 IMPLANT
BLADE SURG SZ10 CARB STEEL (BLADE) ×2 IMPLANT
BLADE SURG SZ11 CARB STEEL (BLADE) ×2 IMPLANT
BLADE SW THK.38XMED LNG THN (BLADE) ×1 IMPLANT
BNDG ESMARK 6X12 TAN STRL LF (GAUZE/BANDAGES/DRESSINGS) ×2 IMPLANT
BRACE KNEE POST OP SHORT (BRACE) ×2 IMPLANT
BUR 4X45 EGG (BURR) ×2 IMPLANT
BUR 4X55 1 (BURR) ×2 IMPLANT
BUR RADIUS 3.5 (BURR) ×2 IMPLANT
CHLORAPREP W/TINT 26 (MISCELLANEOUS) ×2 IMPLANT
COOLER POLAR GLACIER W/PUMP (MISCELLANEOUS) ×2 IMPLANT
COVER MAYO STAND STRL (DRAPES) ×2 IMPLANT
CUFF TOURN SGL QUICK 24 (TOURNIQUET CUFF) ×1
CUFF TOURN SGL QUICK 30 (TOURNIQUET CUFF) ×1
CUFF TRNQT CYL 24X4X16.5-23 (TOURNIQUET CUFF) ×1 IMPLANT
CUFF TRNQT CYL 30X4X21-28X (TOURNIQUET CUFF) ×1 IMPLANT
DRAPE C-ARM XRAY 36X54 (DRAPES) ×2 IMPLANT
DRAPE C-ARMOR (DRAPES) ×2 IMPLANT
DRAPE IMP U-DRAPE 54X76 (DRAPES) ×2 IMPLANT
DRAPE SHEET LG 3/4 BI-LAMINATE (DRAPES) ×2 IMPLANT
DRAPE SURG 17X11 SM STRL (DRAPES) ×2 IMPLANT
DRAPE TABLE BACK 80X90 (DRAPES) ×2 IMPLANT
DRSG OPSITE POSTOP 3X4 (GAUZE/BANDAGES/DRESSINGS) ×2 IMPLANT
ELECT REM PT RETURN 9FT ADLT (ELECTROSURGICAL) ×2
ELECTRODE REM PT RTRN 9FT ADLT (ELECTROSURGICAL) ×1 IMPLANT
GAUZE SPONGE 4X4 12PLY STRL (GAUZE/BANDAGES/DRESSINGS) ×2 IMPLANT
GLOVE SRG 8 PF TXTR STRL LF DI (GLOVE) ×1 IMPLANT
GLOVE SURG ORTHO LTX SZ8 (GLOVE) ×2 IMPLANT
GLOVE SURG UNDER POLY LF SZ8 (GLOVE) ×1
GOWN STRL REUS W/ TWL LRG LVL3 (GOWN DISPOSABLE) ×1 IMPLANT
GOWN STRL REUS W/ TWL XL LVL3 (GOWN DISPOSABLE) ×1 IMPLANT
GOWN STRL REUS W/TWL LRG LVL3 (GOWN DISPOSABLE) ×1
GOWN STRL REUS W/TWL XL LVL3 (GOWN DISPOSABLE) ×1
GRADUATE 1200CC STRL 31836 (MISCELLANEOUS) ×2 IMPLANT
GUIDEWIRE 2.0MM (WIRE) ×2 IMPLANT
HANDLE YANKAUER SUCT BULB TIP (MISCELLANEOUS) ×2 IMPLANT
IV LACTATED RINGER IRRG 3000ML (IV SOLUTION) ×8
IV LR IRRIG 3000ML ARTHROMATIC (IV SOLUTION) ×8 IMPLANT
KIT SUTURE 1.8 Q-FIX DISP (KITS) ×4 IMPLANT
KIT TURNOVER KIT A (KITS) ×2 IMPLANT
LABEL OR SOLS (LABEL) ×2 IMPLANT
MANIFOLD NEPTUNE II (INSTRUMENTS) ×4 IMPLANT
MAT ABSORB  FLUID 56X50 GRAY (MISCELLANEOUS) ×1
MAT ABSORB FLUID 56X50 GRAY (MISCELLANEOUS) ×1 IMPLANT
NDL SAFETY ECLIPSE 18X1.5 (NEEDLE) ×1 IMPLANT
NEEDLE FILTER BLUNT 18X 1/2SAF (NEEDLE) ×1
NEEDLE FILTER BLUNT 18X1 1/2 (NEEDLE) ×1 IMPLANT
NEEDLE HYPO 18GX1.5 SHARP (NEEDLE) ×1
NEEDLE HYPO 21X1.5 SAFETY (NEEDLE) ×2 IMPLANT
NEEDLE HYPO 22GX1.5 SAFETY (NEEDLE) ×2 IMPLANT
NEEDLE SPNL 20GX3.5 QUINCKE YW (NEEDLE) ×2 IMPLANT
NS IRRIG 1000ML POUR BTL (IV SOLUTION) ×2 IMPLANT
PACK ARTHROSCOPY KNEE (MISCELLANEOUS) ×2 IMPLANT
PAD ABD DERMACEA PRESS 5X9 (GAUZE/BANDAGES/DRESSINGS) ×2 IMPLANT
PAD CAST CTTN 4X4 STRL (SOFTGOODS) ×1 IMPLANT
PAD WRAPON POLAR KNEE (MISCELLANEOUS) ×1 IMPLANT
PADDING CAST 6X4YD NS (MISCELLANEOUS) ×1
PADDING CAST COTTON 4X4 STRL (SOFTGOODS) ×1
PADDING CAST COTTON 6X4 NS (MISCELLANEOUS) ×1 IMPLANT
PENCIL ELECTRO HAND CTR (MISCELLANEOUS) ×2 IMPLANT
PENCIL SMOKE EVACUATOR (MISCELLANEOUS) ×2 IMPLANT
RETRIEVER SUT HEWSON (MISCELLANEOUS) ×2 IMPLANT
SCREW CORTEX ST 4.5X44 (Screw) ×2 IMPLANT
SCREW CORTEX ST 4.5X46 (Screw) ×2 IMPLANT
SCREW CORTEX ST 4.5X54 (Screw) ×2 IMPLANT
SET TUBE SUCT SHAVER OUTFL 24K (TUBING) ×2 IMPLANT
SPONGE LAP 18X18 RF (DISPOSABLE) ×2 IMPLANT
SPONGE T-LAP 18X18 ~~LOC~~+RFID (SPONGE) ×4 IMPLANT
STAPLER SKIN PROX 35W (STAPLE) ×2 IMPLANT
SUCTION FRAZIER HANDLE 10FR (MISCELLANEOUS) ×1
SUCTION TUBE FRAZIER 10FR DISP (MISCELLANEOUS) ×1 IMPLANT
SUT ETHILON 3-0 FS-10 30 BLK (SUTURE) ×2
SUT VIC AB 0 CT1 36 (SUTURE) ×2 IMPLANT
SUT VIC AB 2-0 CT2 27 (SUTURE) ×2 IMPLANT
SUTURE EHLN 3-0 FS-10 30 BLK (SUTURE) ×1 IMPLANT
SYR 10ML LL (SYRINGE) ×2 IMPLANT
SYR 30ML LL (SYRINGE) ×2 IMPLANT
SYR 50ML LL SCALE MARK (SYRINGE) ×2 IMPLANT
SYR BULB IRRIG 60ML STRL (SYRINGE) ×2 IMPLANT
TENDON SEMI-TENDINOSUS (Bone Implant) ×2 IMPLANT
TOWEL OR 17X26 4PK STRL BLUE (TOWEL DISPOSABLE) ×2 IMPLANT
TUBING ARTHRO INFLOW-ONLY STRL (TUBING) ×2 IMPLANT
WAND WEREWOLF FLOW 90D (MISCELLANEOUS) ×2 IMPLANT
WRAPON POLAR PAD KNEE (MISCELLANEOUS) ×2

## 2021-02-19 NOTE — Discharge Instructions (Signed)
Post-Op Instructions  1. Bracing or crutches: You will be provided with a long brace (from hip to ankle) and crutches at the surgery center.   2. Ice: You will be provided with a device [facility]) that allows you to ice the affected area effectively.   3. Showering: Incision must remain dry for 5 days. Afterwards, you may shower and gently pat incision dry.  NO submerging wound for 4 weeks. Staples will be removed at your first post-operative appointment in 2 weeks.   4. Driving: You will be given specific driving precautions at discharge. Plan on not driving for at least two weeks for left knee surgery, and 4 weeks for right knee surgery if you are restricted due to the brace and knee motion. Please note that you are advised NOT to drive while taking narcotic pain medications as you may be impaired and unsafe to drive.  5. Activity: Weight bearing: No weight bearing on the affected leg for 4 weeks, then 50% weight bearing for 2 weeks, then full weight bearing at 6 weeks. Bending the knee is limited and will be guided by the physical therapist. Elevate knee above heart level as much as possible for one week. Avoid standing more than 5 minutes (consecutively) for the first week. No exercise involving the knee until cleared by the surgeon or physical therapist.  Avoid long distance travel for 4 weeks.  6. Medications: - You have been provided a prescription for narcotic pain medicine. After surgery, take 1-2 narcotic tablets every 4 hours if needed for severe pain.  - A prescription for anti-nausea medication will be provided in case the narcotic medicine causes nausea - take 1 tablet every 6 hours only if nauseated.  - Take enteric coated aspirin 325 mg once daily for 4 weeks to prevent blood clots.  -Take tylenol 1000 every 8 hours for pain.  May stop tylenol 3 days after surgery or when you are having minimal pain. -DO NOT TAKE IBUPROFEN, ALEVE or OTHER NSAIDs as they can interfere with bone  healing.  -Take Citracal Maximum Strength (calcium citrate + vitamin D), 2 tabs daily.  If you are taking prescription medication for anxiety, depression, insomnia, muscle spasm, chronic pain, or for attention deficit disorder, you are advised that you are at a higher risk of adverse effects with use of narcotics post-op, including narcotic addiction/dependence, depressed breathing, death. If you use non-prescribed substances: alcohol, marijuana, cocaine, heroin, methamphetamines, etc., you are at a higher risk of adverse effects with use of narcotics post-op, including narcotic addiction/dependence, depressed breathing, death. You are advised that taking > 50 morphine milligram equivalents (MME) of narcotic pain medication per day results in twice the risk of overdose or death. For your prescription provided: oxycodone 5 mg - taking more than 6 tablets per day would result in > 50 morphine milligram equivalents (MME) of narcotic pain medication. Be advised that we will prescribe narcotics short-term, for acute post-operative pain, only 3 weeks for major operations such as knee repair/reconstruction surgeries.   7. Bandages: The physical therapist should change the bandages at the first post-op appointment. If needed, the dressing supplies have been provided to you.  8. Physical Therapy: 2 times per week for the first 4 weeks, then 1-2 times per week from weeks 4-8 post-op. Therapy typically starts on post operative Day 3 or 4. You have been provided an order for physical therapy today and should schedule your appointments in advance to avoid delay. The therapist will provide home exercises.  9. Work or School: For most, but not all procedures, we advise staying out of work or school for at least 1 to 2 weeks in order to recover from the stress of surgery and to allow time for healing and swelling control. If you need a work or school note this can be provided.   10. Post-Op Appointments: Your first  post-op appointment will be with Dr. Allena Katz in approximately 2 weeks time. Please double check if this will be at the [facility] facility (Tuesdays and Thursdays) or [facility] facility (Wednesdays).   If you find that they have not been scheduled please call the Orthopaedic Appointment front desk at [phone].

## 2021-02-19 NOTE — Anesthesia Postprocedure Evaluation (Signed)
Anesthesia Post Note  Patient: RICHY SPRADLEY  Procedure(s) Performed: Left knee arthroscopy and chondroplasty with loose body removal, MPFL reconstruction using allograft, and tibial tubercle osteotomy - RNFA needed (Left Knee)  Patient location during evaluation: PACU Anesthesia Type: General Level of consciousness: awake and alert Pain management: pain level controlled Vital Signs Assessment: post-procedure vital signs reviewed and stable Respiratory status: spontaneous breathing, nonlabored ventilation, respiratory function stable and patient connected to nasal cannula oxygen Cardiovascular status: blood pressure returned to baseline and stable Postop Assessment: no apparent nausea or vomiting Anesthetic complications: no   No complications documented.   Last Vitals:  Vitals:   02/19/21 0617 02/19/21 1208  BP: (!) 152/89 (!) 150/94  Pulse: 75 100  Resp: 20 (!) 22  Temp: (!) 36 C (!) 36.1 C  SpO2: 100% 100%    Last Pain:  Vitals:   02/19/21 0617  TempSrc: Temporal  PainSc: 3                  Corinda Gubler

## 2021-02-19 NOTE — Op Note (Addendum)
OPERATIVE NOTE  SURGERY DATE: 02/19/2021  PRE-OP DIAGNOSIS:  1. Left patella recurrent dislocation 2. Left knee patellofemoral misalignment 3. Left lateral patella tilt 4. Left patellofemoral chondral wear   POST-OP DIAGNOSIS:  1. Left patella recurrent dislocation 2. Left knee patellofemoral misalignment 3. Left lateral patella tilt 4. Left patellofemoral chondral wear  PROCEDURES:  1. Left knee tibial tubercle osteotomy 2. Left knee medial patellofemoral ligament reconstruction using allograft 3. Left knee open lateral retinacular release 4. Left knee arthroscopic chondroplasty of trochlea  SURGEON:Mehtab Dolberry Jearld Lesch, MD  ASSISTANT(S): Reche Dixon, PA  ANESTHESIA: Gen + regional anesthesia  TOTAL IV FLUIDS: see anesthesia record  ESTIMATED BLOOD LOSS: 300cc  TOURNIQUET TIME: 90 min  DRAINS: medium 10 Fr Hemovac  SPECIMENS: None.  IMPLANTS:  - 4.74m cortical screws x 3 - Qfix double loaded suture anchor x 2 - Qfix Mini suture anchors x 1 - Semitendinosus allograft 828m  COMPLICATIONS: None apparent.  INDICATIONS: ThCYAN MOULTRIEs a [age].o. male with knee pain and patellar instability. He has undergone prior knee arthroscopy, lateral release, and loose body removal, but had recurrent dislocation. 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 Given the patient's activity level, age, and gender, surgery was recommended for tibial tubercle osteotomy to improve patella alignment and unload the patellofemoral joint as well as MPFL reconstruction to reduce chance of recurrent patellar dislocation. The patient also had lateral tilt of the patella so lateral release was indicated. We had planned to perform diagnostic knee arthroscopy with chondroplasty prior to proceeding with the tibial tubercle osteotomy and MPFL reconstruction. After discussion of risks, benefits, and alternatives to surgery, the patient elected to proceed.    OPERATIVE  FINDINGS:    Examination under anesthesia: A careful examination under anesthesia was performed.  Passive range of motion was: Hyperextension: 3.  Extension: 0.  Flexion: 120.  Lachman: normal. Pivot Shift: normal.  Posterior drawer: normal.  Varus stability in full extension: normal.  Varus stability in 30 degrees of flexion: normal.  Valgus stability in full extension: normal.  Valgus stability in 30 degrees of flexion: normal. Patella: 3 quadrants lateral mobility, 2 quadrants medial mobility, lateral patellar tracking, and lateral tilt   Intra-operative findings: A thorough arthroscopic examination of the knee was performed.  The findings are: 1. Suprapatellar pouch:  normal 2. Undersurface of median ridge: Grade 1 softening 3. Medial patellar facet: Grade 1 softening 4. Lateral patellar facet: Grade 1 softening 5. Trochlea: Dysplastic with shallow trochlear groove and focal area approximately 10 x 10 mm of grade 2-3 degenerative change over lateral trochlea with extension to the central trochlea 6. Lateral gutter/popliteus tendon: Normal 7. Hoffa's fat pad: Inflamed 8. Medial gutter/plica: Normal 9. ACL: Normal 10. PCL: Normal 11. Medial meniscus: Normal 12. Medial compartment cartilage: Focal area of grade 2 degenerative change to the femoral condyle adjacent to the intercondylar notch; normal tibial plateau 13. Lateral meniscus: Normal 14. Lateral compartment cartilage: Normal lateral femoral condyle, focal area of grade 1-2 degenerative changes to the tibial plateau  DETAILS OF PROCEDURE: The patient was identified in the preoperative holding area and the appropriate operative extremity was marked.  Informed consent was again verified with the patient and parents.  The patient was then brought to the operating room and placed supine on the OR table.  A tourniquet was placed on the thigh.  All bony prominences were well-padded and the arms were in a neutral position.  The operative  extremity  was prescrubbed with Hibiclens and alcohol, prepped with ChloraPrep, and draped in the usual sterile fashion. The patient was given preoperative IV antibiotics and a surgical time-out confirming patient identity, procedure, and laterality was performed.   We first completed the knee arthroscopy portion of the procedure.  A standard anterolateral portal was made with an 11 blade. A standard anteromedial portal was made using needle localization. A diagnostic arthroscopy was performed with the above findings.  Chondroplasty of the trochlea was performed for the trochlear chondral defect described above was debrided using an oscillating shaver until there were stable cartilage edges.  This concluded the arthroscopic portion of the procedure.  Next, the tibial tubercle osteotomy was performed.  An Esmarch bandage was used to exsanguinate the leg and tourniquet was inflated.  Midline incision from the inferior pole of the patella to approximately 10 cm distal to the tibial tubercle was made. Medial and lateral flaps were developed.  The patellar tendon was identified and protected.  The anterior compartment musculature was released subperiosteally and protected.  Guidewires were placed from medial to lateral and an approximately 45 degree angle. An oscillating saw was used to make the cut just inferior to the guidepins. The distal aspect of the shingle was left intact. Osteotomes were used proximally to finish the lateral and medial cuts on the shingle.  The proximal aspect of the tibial tubercle was then elevated and shifted medially ~5m.  A guidepin was used to hold the osteotomy in place.  Three, fully-threaded cortical screws, 4.5 mm in diameter were then placed in a lag fashion. The countersink was used to limit the prominence of the screw heads. Position of osteotomy and hardware was confirmed fluoroscopically. Bone wax was used to cover the non-healing bony surfaces to limit cancellous bleeding.  Medial overhang of the shingle was sawed off. There was no unusual bleeding.   A medium Hemovac drain was placed along the tibial tubercle osteotomy site. At this point, the patella was still noted to have lateral tilt. The previous midline incision was extended proximally such that the superior portion of the patella could be accessed. An open lateral retinacular release was performed by extending the incision along the lateral aspect of the patellar tendon proximally.  Care was taken to ensure that the release stopped distal to the vastus lateralis tendon and that the geniculate arteries were not disturbed. This improved the lateral tilt of the patella.   Next, the MPFL reconstruction was performed.  There was a loose bony piece of the medial patella.  This was carefully excised.  Careful dissection was performed to identify layers 1 and 2. A blunt kelly clamp was then passed between layers 2 and 3 posteriorly towards the femoral origin of the MPFL, confirming that this was the appropriate layer. Using bovie electrocautery, dissection onto the medial surface of the patella was performed, stopping short of exposing the articular margin. A rongeur was used to create a trough at the midportion of the patella. Using fluoroscopy, two Q-fix mini all suture anchors were placed into the trough created on the patella. The first was placed at the midportion of the patella in the superior/inferior aspect, and the second was placed ~2cm superior near the angle of the patella. Next, Schottle's point was localized with fluoroscopy and an incision was made over it. Dissection was carried down with bovie electrocautery and fascia was incised longitudinally.  A drill guide for a Q fix double loaded anchor was placed over Schottle's point.  This was  confirmed fluoroscopically.  Then, aiming approximately 20 degrees proximal and anterior, the Q fix anchor was placed.  The semitendinosus allograft was marked at the midportion.  One  limb of each of the 2 sutures was placed above the graft and the other limb was placed below the graft.  A circumferential, loop stitch was tied on either side of the central mark on the allograft.  This appropriately brought the graft to the anchor at Schottle's point.  A Kelly clamp was passed in between layers 2 and 3 to retreive both free ends of the graft. The graft limbs were held at a resting tension. One limb of the inferior patella anchor was passed in a non-locking Krakow fashion through one free end of the graft and the second limb was passed through the graft in a simple fashion. This limb became the post and was used to shuttle the graft down to the anchor. This was then tied with alternating half-hitches. Similarly, this was performed for the more superior suture anchor and the other free end of the graft. However, during this process the proximal Q fix mini anchor pulled out. Therefore a regular Q fix was drilled over this hole and a Q fix anchor was placed.  1 number of sutures was used to secure the graft to the anchor as described above. This restored appropriate tension of the graft.  There was no patellar mal-tracking. Patella could not be dislocated laterally with appropriate constraint of the patella. The knee was taken through a range of motion and full hyperextension and 90 degrees flexion were easily achieved.   All wounds were thoroughly irrigated. The midline incision was closed with 0-Vicryl sutures to close the split between layers 2 and 3. 0-vicryl was also used to close a deeper layer of the length of the midline incision. 2-0 Vicryl and staples were used to close the skin. Similarly, the medial distal femoral incision was closed with 2-0 Vicryl and staples.  Arthroscopic portal incisions were closed with 3-0 nylon.  Honeycomb dressing was applied.  Leg was wrapped in cotton and bias wrap.  Polar Care and hinged knee brace locked at 0 degrees were applied.  The patient was brought to  PACU in stable condition.   Instrument, sponge, and needle counts were correct prior to wound closure and at the conclusion of the case.   Of note, assistance from a PA was essential to performing the surgery.  PA was present for the entire surgery.  PA assisted with patient positioning, retraction, instrumentation, and wound closure. The surgery would have been more difficult and had longer operative time without PA assistance.    Additionally, this case had significantly increased complexity compared to standard procedures due to the patient's morbid obesity.  The patient has a BMI of 60 and weighs approximately 216 kg.  This led to significantly extra time positioning the patient appropriately.  Additionally, the incisions were longer than usual in order to allow for improved visualization and instrumentation.   Despite this, visualization was still somewhat limited leading to increased surgical time. Tourniquet also became a venous tourniquet intraoperatively, leading to increased bleeding, likely due to the patient's size. Finally there was an extra layer of closure to close the dead space.  All of these factors added approximately 30 minutes to the total operative time.         DISPOSITION: PACU - hemodynamically stable.  POSTOPERATIVE PLAN: The patient will be discharged home tomorrow after overnight stay for appropriate pain control,  drain monitoring, and monitoring for compartment syndrome.  Antibiotics will be given overnight, but discontinued within 24 hours of surgery.  Aspirin 325 mg/day x 4 weeks for DVT prophylaxis.  The patient will be non-weight bearing for 4 weeks, and then may 50% weight bear from weeks 4 to 6, and may fully weight bear at week 6.  Brace will be locked for ambulation for the first 8 weeks using 2 crutches.    Physical therapy to begin post-op day 3-4 for knee range of motion, patellar mobilization and scar mobilization.  CPM machine is already been delivered to  house.   Return to clinic 10-14 days as scheduled.              REHAB PROTOCOL: Dr. Cato Mulligan, M.D.   PATELLAR REALIGNMENT PROTOCOL -  MPFL RECONSTRUCTION AND TIBIAL TUBERCLE OSTEOTOMY ________________________________________________________________________  GENERAL GUIDELINES: . No closed kinetic chain exercise for 4 weeks . The same rehabilitation protocol is followed for proximal and distal realignments with the exception of range of motion limitations as noted  GENERAL PROGRESSION OF ACTIVITIES OF DAILY LIVING: Patients may begin the following activities at the dates indicated (unless otherwise specified by the physician): Marland Kitchen Bathing/showering after 1 week . Sleep with brace locked in extension for 4 weeks . Driving at 4 weeks post-op . Brace locked in extension for 8 weeks for ambulation . Use of crutches continued for 8 weeks post-op . No weightbearing for 4 weeks, then 50% weight-bearing for 2 more weeks . Full weight-bearing at 6 weeks  REHABILITATION PROGRESSION: The following is a general guideline for progression of the rehabilitation program following patellar realignment.  Progression through each phase should take into consideration patient status (e.g. healing, function) and physician advisement.  Please consult the attending physician if there is uncertainty regarding the advancement of a patient to the next phase of rehabilitation.  PHASE I: Begins immediately post-op through approximately 6 weeks.  Goals: . Protect fixation and surrounding soft tissue . Control inflammatory process . Regain active quadriceps and VMO control . Minimize the adverse effects of immobilization through CPM and heel slides in the allowed range of motion . Full knee extension . Patient education regarding rehabilitation process  ROM: . 0 - 4 weeks: 0 - 90 flexion  Brace: . 0 - 4 weeks: Locked in full extension for all activities except therapeutic exercises and  CPM use     Locked in full extension for sleeping . 4 - 6 weeks: Unlock brace for sleeping, continue with brace locked in full extension for ambulation   Weightbearing Status: . No weight-bearing for 4 weeks, then 50% weight-bearing for 2 more weeks . Full weight-bearing at 6 weeks  Therapeutic Exercises: . Extreme attention to patellar mobs in 4 directions, especially inferior and superior. Massage infrapatellar fat pad. Perform, but take caution with lateral mobs as to not stress MPFL reconstruction . Quad sets and isometric adduction with biofeedback for VMO . Heel slides from 0-90 of flexion  . CPM for 8 hours daily. . Week 1: 0-45 . Week 2: 0-60 . Week 3: 0-75 . Week 4: 0-90 . Non-weight bearing gastrocnemius/soleus hamstring stretches . SLR in four planes with brace locked in full extension (can be performed in standing) . Resisted ankle ROM with theraband . Patellar mobilization (begin when tolerated by patient)  PHASE II: Begins approximately 6 weeks post-op and extends to approximately 8 weeks post-op.  Criteria for advancement to Phase II: . Good quad set . Approximately 90  of flexion . No signs of active inflammation  Goals: . Increase range of flexion . Avoid overstressing fixation . Increase quadriceps and VMO control for restoration of proper patellar tracking.  Brace: 6 - 8 weeks: Discontinue use for sleeping, unlock for ambulation as allowed by physician.  Weightbearing Status: 6-8 weeks: As tolerated with two crutches if cleared.  Therapeutic Exercises: . Continue exercises as noted above, progress towards full flexion with heel slides . Progress to weight-bearing gastrocnemius/soleus stretching . Discontinue CPM if knee flexion is at least 90 . Begin aquatic therapy, emphasis on normalization of gait . Balance exercises (e.g. single-leg standing, KAT) . Remove brace for SLR exercises . Stationary bike, low resistance, high seat . Short arc  quadriceps exercises in pain free ranges (0-20, 60-90 of flexion) emphasize movement quality . Wall slides progressing to mini-squats, 0-45 of flexion  PHASE III: Begins approximately 8 weeks post-op and extends through approximately 4 months.  Criteria for advancement to Phase III: . Good quadriceps tone and no extension lag with SLR . Non-antalgic gait pattern . Good dynamic patellar control with no evidence of lateral tracking or instability  Weightbearing Status: May discontinue use of crutches when the following criteria are met: . No extension lag with SLR . Full extension . Non-antalgic gait pattern (may use one crutch or cane until gait is normalized)  Therapeutic Exercises: . Stationary bike, add moderate resistance . 4 way hip flexion, adduction, abduction, extension . Leg press 0-45 of flexion . Closed kinetic chain terminal knee extension with resistive tubing or weight machine . Swimming, Metallurgist for endurance . Toe raises . Hamstring curls . Treadmill walking with emphasis on normalization of gait  . Continue proprioception exercises . Continue flexibility exercises for gastrocnemius/soleus and hamstrings, add iliotibial band and quadriceps as indicated . At 3 months: Step-ups, begin at 2" and progress towards 8"  PHASE IV: Begins approximately 4 months post-op and extends through approximately 6 months.  Criteria for advancement to Phase IV: . Good to normal quadriceps strength . No evidence of patellar instability . No soft tissue complaints . Normal gait pattern . Clearance from physician to begin more concentrated closed kinetic chain exercises, and resume full or partial activity  Goals: . Continue improvements in quadriceps strength . Improve functional strength and proprioception . Return to appropriate activity level  Therapeutic Exercises: . Progression of closed kinetic chain activities . Jogging in pool with wet vest or belt . Functional  progression, sport-specific activities or work hardening as appropriate    For any questions or concerns regarding the protocol or rehabilitation process please contact my office: [facility] Phone: [phone] Fax: [phone]

## 2021-02-19 NOTE — Anesthesia Procedure Notes (Signed)
Procedure Name: Intubation Date/Time: 02/19/2021 7:43 AM Performed by: Danelle Berry, CRNA Pre-anesthesia Checklist: Patient identified, Emergency Drugs available, Suction available and Patient being monitored Patient Re-evaluated:Patient Re-evaluated prior to induction Oxygen Delivery Method: Circle system utilized Preoxygenation: Pre-oxygenation with 100% oxygen Induction Type: IV induction Ventilation: Mask ventilation without difficulty Laryngoscope Size: McGraph and 4 Grade View: Grade I Tube type: Oral Tube size: 7.5 mm Number of attempts: 1 Airway Equipment and Method: Stylet and Oral airway Placement Confirmation: ETT inserted through vocal cords under direct vision,  positive ETCO2 and breath sounds checked- equal and bilateral Tube secured with: Tape Dental Injury: Teeth and Oropharynx as per pre-operative assessment

## 2021-02-19 NOTE — Anesthesia Preprocedure Evaluation (Signed)
Anesthesia Evaluation  Patient identified by MRN, date of birth, ID band Patient awake    Reviewed: Allergy & Precautions, NPO status , Patient's Chart, lab work & pertinent test results  History of Anesthesia Complications Negative for: history of anesthetic complications  Airway Mallampati: III  TM Distance: >3 FB Neck ROM: Full    Dental  (+) Teeth Intact   Pulmonary neg pulmonary ROS, neg sleep apnea, neg COPD, Patient abstained from smoking.Not current smoker,    Pulmonary exam normal breath sounds clear to auscultation       Cardiovascular Exercise Tolerance: Good METShypertension, (-) CAD and (-) Past MI (-) dysrhythmias  Rhythm:Regular Rate:Normal - Systolic murmurs    Neuro/Psych negative neurological ROS  negative psych ROS   GI/Hepatic neg GERD  ,(+)     (-) substance abuse  ,   Endo/Other  diabetesMorbid obesity  Renal/GU negative Renal ROS     Musculoskeletal   Abdominal (+) + obese,   Peds  Hematology  (+) anemia ,   Anesthesia Other Findings Past Medical History: No date: Diabetes mellitus without complication (HCC)     Comment:  No meds-Last A1C in 2020 was 5.4 No date: Elevated liver enzymes No date: Family history of adverse reaction to anesthesia     Comment:  PTS TWIN BROTHER-HARD TIME TO WAKE UP No date: Hyperlipidemia No date: Hypertension     Comment:  off meds x 3-4 years No date: Morbid obesity (HCC) No date: Normocytic normochromic anemia No date: Obesity  Reproductive/Obstetrics                             Anesthesia Physical Anesthesia Plan  ASA: III  Anesthesia Plan: General   Post-op Pain Management:    Induction: Intravenous  PONV Risk Score and Plan: 2 and Ondansetron, Dexamethasone, Midazolam and Treatment may vary due to age or medical condition  Airway Management Planned: Oral ETT and Video Laryngoscope Planned  Additional Equipment:  None  Intra-op Plan:   Post-operative Plan: Extubation in OR  Informed Consent: I have reviewed the patients History and Physical, chart, labs and discussed the procedure including the risks, benefits and alternatives for the proposed anesthesia with the patient or authorized representative who has indicated his/her understanding and acceptance.     Dental advisory given  Plan Discussed with: CRNA and Surgeon  Anesthesia Plan Comments: (Discussed risks of anesthesia with patient, including PONV, sore throat, lip/dental damage. Rare risks discussed as well, such as cardiorespiratory and neurological sequelae. Patient understands. Discussed r/b/a of adductor canal nerve block, including:  - bleeding, infection, nerve damage - poor or non functioning block. Discussed that it may not be possible to perform block given patient's body habitus and large leg. Will look with ultrasound postop. Patient understands. )        Anesthesia Quick Evaluation

## 2021-02-19 NOTE — Transfer of Care (Signed)
Immediate Anesthesia Transfer of Care Note  Patient: Todd Ward  Procedure(s) Performed: Left knee arthroscopy and chondroplasty with loose body removal, MPFL reconstruction using allograft, and tibial tubercle osteotomy - RNFA needed (Left Knee)  Patient Location: PACU  Anesthesia Type:General  Level of Consciousness: awake and oriented  Airway & Oxygen Therapy: Patient Spontanous Breathing and Patient connected to face mask oxygen  Post-op Assessment: Report given to RN  Post vital signs: Reviewed  Last Vitals:  Vitals Value Taken Time  BP    Temp    Pulse 100 02/19/21 1208  Resp 22 02/19/21 1208  SpO2 100 % 02/19/21 1208  Vitals shown include unvalidated device data.  Last Pain:  Vitals:   02/19/21 0617  TempSrc: Temporal  PainSc: 3       Patients Stated Pain Goal: 0 (02/19/21 0617)  Complications: No complications documented.

## 2021-02-19 NOTE — H&P (Signed)
Paper H&P to be scanned into permanent record. H&P reviewed. No significant changes noted.  

## 2021-02-19 NOTE — Plan of Care (Signed)
  Problem: Education: Goal: Knowledge of General Education information will improve Description: Including pain rating scale, medication(s)/side effects and non-pharmacologic comfort measures Outcome: Progressing   Problem: Health Behavior/Discharge Planning: Goal: Ability to manage health-related needs will improve Outcome: Progressing   Problem: Clinical Measurements: Goal: Ability to maintain clinical measurements within normal limits will improve Outcome: Progressing Goal: Will remain free from infection Outcome: Progressing Goal: Diagnostic test results will improve Outcome: Progressing Goal: Respiratory complications will improve Outcome: Progressing Goal: Cardiovascular complication will be avoided Outcome: Progressing   Problem: Activity: Goal: Risk for activity intolerance will decrease Outcome: Progressing   Problem: Nutrition: Goal: Adequate nutrition will be maintained Outcome: Progressing   Problem: Coping: Goal: Level of anxiety will decrease Outcome: Progressing   Problem: Elimination: Goal: Will not experience complications related to bowel motility Outcome: Progressing Goal: Will not experience complications related to urinary retention Outcome: Progressing   Problem: Pain Managment: Goal: General experience of comfort will improve Outcome: Progressing   Problem: Safety: Goal: Ability to remain free from injury will improve Outcome: Progressing   Problem: Skin Integrity: Goal: Risk for impaired skin integrity will decrease Outcome: Progressing   Problem: Education: Goal: Knowledge of the prescribed therapeutic regimen will improve Outcome: Progressing Goal: Individualized Educational Video(s) Outcome: Progressing   Problem: Activity: Goal: Ability to avoid complications of mobility impairment will improve Outcome: Progressing Goal: Range of joint motion will improve Outcome: Progressing   Problem: Clinical Measurements: Goal:  Postoperative complications will be avoided or minimized Outcome: Progressing   Problem: Pain Management: Goal: Pain level will decrease with appropriate interventions Outcome: Progressing   Problem: Skin Integrity: Goal: Will show signs of wound healing Outcome: Progressing   Problem: Education: Goal: Ability to describe self-care measures that may prevent or decrease complications (Diabetes Survival Skills Education) will improve Outcome: Progressing Goal: Individualized Educational Video(s) Outcome: Progressing   Problem: Coping: Goal: Ability to adjust to condition or change in health will improve Outcome: Progressing   Problem: Fluid Volume: Goal: Ability to maintain a balanced intake and output will improve Outcome: Progressing   Problem: Health Behavior/Discharge Planning: Goal: Ability to identify and utilize available resources and services will improve Outcome: Progressing Goal: Ability to manage health-related needs will improve Outcome: Progressing   Problem: Metabolic: Goal: Ability to maintain appropriate glucose levels will improve Outcome: Progressing   Problem: Nutritional: Goal: Maintenance of adequate nutrition will improve Outcome: Progressing Goal: Progress toward achieving an optimal weight will improve Outcome: Progressing   Problem: Skin Integrity: Goal: Risk for impaired skin integrity will decrease Outcome: Progressing   Problem: Tissue Perfusion: Goal: Adequacy of tissue perfusion will improve Outcome: Progressing

## 2021-02-20 ENCOUNTER — Encounter: Payer: Self-pay | Admitting: Orthopedic Surgery

## 2021-02-20 DIAGNOSIS — S8332XA Tear of articular cartilage of left knee, current, initial encounter: Secondary | ICD-10-CM | POA: Diagnosis not present

## 2021-02-20 LAB — CBC
HCT: 36 % — ABNORMAL LOW (ref 39.0–52.0)
Hemoglobin: 12.1 g/dL — ABNORMAL LOW (ref 13.0–17.0)
MCH: 27.5 pg (ref 26.0–34.0)
MCHC: 33.6 g/dL (ref 30.0–36.0)
MCV: 81.8 fL (ref 80.0–100.0)
Platelets: 251 10*3/uL (ref 150–400)
RBC: 4.4 MIL/uL (ref 4.22–5.81)
RDW: 13.3 % (ref 11.5–15.5)
WBC: 12.2 10*3/uL — ABNORMAL HIGH (ref 4.0–10.5)
nRBC: 0 % (ref 0.0–0.2)

## 2021-02-20 LAB — HIV ANTIBODY (ROUTINE TESTING W REFLEX): HIV Screen 4th Generation wRfx: NONREACTIVE

## 2021-02-20 MED ORDER — OXYCODONE HCL 5 MG PO TABS: 5.0000 mg | ORAL_TABLET | ORAL | 0 refills | Status: DC | PRN

## 2021-02-20 MED ORDER — ONDANSETRON HCL 4 MG PO TABS: 4.0000 mg | ORAL_TABLET | Freq: Four times a day (QID) | ORAL | 0 refills | Status: DC | PRN

## 2021-02-20 MED ORDER — TRAMADOL HCL 50 MG PO TABS: 50.0000 mg | ORAL_TABLET | Freq: Four times a day (QID) | ORAL | 0 refills | Status: DC | PRN

## 2021-02-20 MED ORDER — ASPIRIN 325 MG PO TBEC: 325.0000 mg | DELAYED_RELEASE_TABLET | Freq: Every day | ORAL | 0 refills | Status: DC

## 2021-02-20 MED ORDER — METHOCARBAMOL 500 MG PO TABS: 500.0000 mg | ORAL_TABLET | Freq: Four times a day (QID) | ORAL | 0 refills | Status: DC | PRN

## 2021-02-20 NOTE — TOC Progression Note (Signed)
Transition of Care [facility]) - Progression Note    Patient Details  Name: [name] MRN: [id number] Date of Birth: October 24, 2000  [facility] CM/SW Contact  Barrie Dunker, RN Phone Number: 02/20/2021, 11:25 AM  Clinical Narrative:   Called the patients insurance company at [phone] ext [phone], top obtain prior auth to go to SNF the number to call is [phone], he has 80% after deductible, 107 days calendar year available, deductible is 2000,     Expected Discharge Plan: Home w Home Health Services Barriers to Discharge: Continued Medical Work up  Expected Discharge Plan and Services Expected Discharge Plan: Home w Home Health Services   Discharge Planning Services: CM Consult   Living arrangements for the past 2 months: Single Family Home Expected Discharge Date: 02/20/21               DME Arranged: 3-N-1 DME Agency: [employer] Date DME Agency Contacted: 02/20/21 Time DME Agency Contacted: [phone] Representative spoke with at DME Agency: Juliette Alcide Arranged: PT HH Agency: [facility] ([facility] Date HH Agency Contacted: 02/20/21 Time HH Agency Contacted: [phone] Representative spoke with at [facility] Agency: [place]   Social Determinants of Health (SDOH) Interventions    Readmission Risk Interventions No flowsheet data found.

## 2021-02-20 NOTE — Progress Notes (Signed)
Physical Therapy Treatment Patient Details Name: Todd Ward MRN: [id number] DOB: [birth year] Today's Date: 02/20/2021    History of Present Illness Todd Ward is a [place] Male who presents after elective Left knee arthroscopy and chondroplasty with loose body removal, MPFL reconstruction using allograft, and tibial tubercle osteotomy. 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    PT Comments    Pt needing extra time and extra cuing t/o essentially all aspects of PT.  He showed good effort with all tasks but either fatigued too quickly to do any sustained activity.  Pt showed reasonable effort to keep as much weight as possible off L LE t/o the session, however he very much struggled to fully keep weight off it and despite his assurances that he was putting less than 5lbs through it, this seemed unlikely, especially with increased UE fatigue.  He did do better with crutches in this regard as he could stop and rest on axillary supports between each hop, however he was more stable and able to keep more weight off of LE while actively using walker (though again quicker to fatigue).  He is showing improvement with getting to standing, but again can not fully maintain NWBing and from lower (typical) surface heights he did need at least some assist and plenty of cuing and encouragement.  Pt was able to trial steps 4 separate times - some with walker, some with rails all with great struggle.  He did manage to do 2 consecutive steps with extremely heavy rail use and devolving ability to maintain WBing precautions even nominally.  Pt and father with many questions and plenty of education, explanation and consistent encouragement.  Pt very much wanting to go home but is not safe to enter/exit home, can not tolerate more than a few feet of ambulation at a time before completely fatiguing in UEs and even with this unable to fully maintain NWBing on L.  PT cannot recommend a discharge home as there are  too many factors that would make this unsafe and ultimately the risk of disrupting postsurgical recovery is far too high.  Follow Up Recommendations  Supervision for mobility/OOB;SNF     Equipment Recommendations   (has bariwalker?, needs bari BSC, possibly w/c and per ortho new KI)    Recommendations for Other Services       Precautions / Restrictions Precautions Precautions: Fall;Knee Required Braces or Orthoses: Knee Immobilizer - Left Knee Immobilizer - Left: On at all times Restrictions Weight Bearing Restrictions: Yes LLE Weight Bearing: Non weight bearing    Mobility  Bed Mobility Overal bed mobility: Needs Assistance Bed Mobility: Supine to Sit;Sit to Supine     Supine to sit: Mod assist Sit to supine: Mod assist   General bed mobility comments: direct assist with L LE management, unable to contorl on his own even using UEs    Transfers Overall transfer level: Needs assistance Equipment used: Rolling walker (2 wheeled) Transfers: Sit to/from Stand Sit to Stand: From elevated surface;Min assist         General transfer comment: 5 seperate standing attempts. From elevated surface on first attempt, then from gradually decreasing heights; increasing effort and hesitancy with lower heights but generally able to rise with only minimal assist, did not effectively keep L LE off the ground but did appear to maintain very little weight on it each time.  Ambulation/Gait Ambulation/Gait assistance: Min assist Gait Distance (Feet): 16 Feet Assistive device: Rolling walker (2 wheeled)  General Gait Details: Pt managed to do ~8 ft with walker and thenabout that again with crutches.  Pt did appear to genuinely keep most of his weight off the L but he was very  quick to fatigue and even with this modest bout of ambulation it appeared that more and more weight was being accepted through L LE with HR to ~130 and reports of signficant UE fatigue   Stairs Stairs:  Yes Stairs assistance: Mod assist;+2 physical assistance Stair Management: Backwards;Two rails;With walker Number of Stairs: 2 General stair comments: many attempts at stair negotiation, never able to completely maintain NWBing and again fatiguing very quickly with decreased ability to maintain minimal WBing on the L and started to clearly rely on it to manage.  We never did more than 2 steps at a time with heavy UEs on the rails, trial with walker with less confidence and unability to maintain NWbing effectively   Wheelchair Mobility    Modified Rankin (Stroke Patients Only)       Balance Overall balance assessment: Needs assistance Sitting-balance support: Bilateral upper extremity supported;Feet supported Sitting balance-Leahy Scale: Good     Standing balance support: Bilateral upper extremity supported;During functional activity Standing balance-Leahy Scale: Fair Standing balance comment: Pt highly reliant on the walker, quick to fatigue with any standing and essentially unable maintain NWBing, with devolving ability to keep weight off L                            Cognition Arousal/Alertness: Awake/alert Behavior During Therapy: WFL for tasks assessed/performed Overall Cognitive Status: Within Functional Limits for tasks assessed                                        Exercises      General Comments        Pertinent Vitals/Pain Pain Assessment: 0-10 Pain Score: 4  Pain Location: increased pain with most acts, especially lifting against gravity and while trying upright acts with, despite good effort, some weight on the L    Home Living                      Prior Function            PT Goals (current goals can now be found in the care plan section) Progress towards PT goals: Progressing toward goals    Frequency    BID      PT Plan Current plan remains appropriate    Co-evaluation              AM-PAC PT "6  Clicks" Mobility   Outcome Measure  Help needed turning from your back to your side while in a flat bed without using bedrails?: A Little Help needed moving from lying on your back to sitting on the side of a flat bed without using bedrails?: A Lot Help needed moving to and from a bed to a chair (including a wheelchair)?: Total Help needed standing up from a chair using your arms (e.g., wheelchair or bedside chair)?: Total Help needed to walk in hospital room?: Total Help needed climbing 3-5 steps with a railing? : Total 6 Click Score: 9    End of Session Equipment Utilized During Treatment: Gait belt;Left knee immobilizer Activity Tolerance: Patient tolerated treatment well;No increased pain Patient left: in bed;with call bell/phone  within reach;with family/visitor present Nurse Communication: Mobility status;Patient requests pain meds PT Visit Diagnosis: Unsteadiness on feet (R26.81);Other abnormalities of gait and mobility (R26.89);Difficulty in walking, not elsewhere classified (R26.2)     Time: 1341-1510 PT Time Calculation (min) (ACUTE ONLY): 89 min  Charges:  $Gait Training: 38-52 mins $Therapeutic Exercise: 38-52 mins                     Malachi Pro, DPT 02/20/2021, 5:43 PM

## 2021-02-20 NOTE — TOC Progression Note (Addendum)
Transition of Care [facility]) - Progression Note    Patient Details  Name: Todd Ward MRN: [id number] Date of Birth: Dec 15, 2000  [facility]) CM/SW [place], RN Phone Number: 02/20/2021, 10:58 AM  Clinical Narrative:   Corene Cornea with [place], and [place] with [facility] have notified me that the patient's insurance in Not in Network with them, I called [employer] company to determine who is INN Was provided with a phone number for [employer] [phone] I called and they stated the patient has a $2000 dollar deductible that has not been met, they cover 80% after deductible, I called and spoke to the patient and his father and explained that none of the Home health agencies are INN and that the insurance would only pay 80% after deductible paid, He stated that he has an attorney and that he fell at a restaurant, he provided a number for [facility] [place] [phone] with his lawyer I explained that so far no home health agency is INN and will not accept the patient, I asked the patient and his father if the patient could go to outpatient PT and he will not be able to get in and out of the house into a vehicle. I notified the physician   Expected Discharge Plan: [place] Barriers to Discharge: Continued Medical Work up  Expected Discharge Plan and Services Expected Discharge Plan: [place]   Discharge Planning Services: CM Consult   Living arrangements for the past 2 months: Single Family Home Expected Discharge Date: 02/20/21               DME Arranged: 3-N-1 DME Agency: [employer] Date DME Agency Contacted: 02/20/21 Time DME Agency Contacted: [phone] Representative spoke with at DME Agency: [place]: PT [place]: [facility] [employer] Date [place]: 02/20/21 Time [place]: [phone] Representative spoke with at [place]: [place]   Social  Determinants of Health ([place]) Interventions    Readmission Risk Interventions No flowsheet data found.

## 2021-02-20 NOTE — Progress Notes (Signed)
Inpatient Rehabilitation Admissions Coordinator  Received request for Inpt rehab screen. I asked our Medical Director, Dr. Riley Kill to review case for CIR and he feels patient lacks the medical neccesity required of a CIR admit. I have notified TOC RN CM, Delilah. Other rehab venue options need to be pursued. We will not pursue CIR admit. Please call me with any questions.  Ottie Glazier, RN, MSN Rehab Admissions Coordinator [phone] 02/20/2021 2:03 PM

## 2021-02-20 NOTE — Progress Notes (Signed)
Met with the patient and his father in the room, he lives at home with his dad, He has multiple steps to get inside, he has a Bariatric RW and crutches at home but will need bariatric 3 in 1, Notified Rhonda with [employer] Notified [facility] need.

## 2021-02-20 NOTE — Evaluation (Signed)
Occupational Therapy Evaluation Patient Details Name: Todd Ward MRN: [id number] DOB: Apr 24, 2001 Today's Date: 02/20/2021    History of Present Illness Todd Ward is a [place] Male who presents after elective Left knee arthroscopy and chondroplasty with loose body removal, MPFL reconstruction using allograft, and tibial tubercle osteotomy. 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   Clinical Impression   Pt seen for OT evaluation this date, POD#1 from above surgery. Pt was independent in all ADLs prior to surgery, however using crutches with mobility due to L knee pain following injury March 2022 (no prior AD use). At baseline, pt works and drives. Pt is eager to return to PLOF with less pain and improved safety and independence. Pt instructed in polar care mgt, falls prevention strategies, and home/routines modifications; pt and father verbalized understanding. Pt currently requires MAX A for LB dressing while in seated position due to 10/10 pain and limited AROM of L knee. Anticipate pt requiring MAX A for all seated and standing ADLs d/t pt currently requiring bilateral UE support for static balance while sitting and standing. Pt requires MOD Ax2 for bed mobility. Initially, pt required MAX A for sit>stand transfer however progressed to MIN GUARD from elevated surface with bari RW in subsequent attempts. Pt would benefit from additional skilled OT services, including additional instruction in techniques, with or without assistive devices, for dressing and bathing skills to support recall and carryover prior to discharge and ultimately to maximize safety, independence, and minimize falls risk and caregiver burden. Upon discharge, recommend CIR.     Follow Up Recommendations  CIR    Equipment Recommendations  3 in 1 bedside commode       Precautions / Restrictions Precautions Precautions: Fall;Knee Required Braces or Orthoses: Knee Immobilizer - Left Knee Immobilizer - Left:  On at all times Restrictions Weight Bearing Restrictions: Yes LLE Weight Bearing: Non weight bearing      Mobility Bed Mobility Overal bed mobility: Needs Assistance Bed Mobility: Supine to Sit;Sit to Supine     Supine to sit: Mod assist Sit to supine: Mod assist;+2 for physical assistance   General bed mobility comments: help with trunk and operative limb    Transfers Overall transfer level: Needs assistance Equipment used: Rolling walker (2 wheeled) Transfers: Sit to/from Stand Sit to Stand: From elevated surface         General transfer comment: Initially MAX A for sit>stand transfer however progressed to MIN GUARD from elevated surface with bari RW    Balance Overall balance assessment: Needs assistance Sitting-balance support: Bilateral upper extremity supported;Feet supported Sitting balance-Leahy Scale: Fair Sitting balance - Comments: With b/l UE on bed, pt able to maintain static sitting balance at EOB; unable to shirt weight, requiring MAX A for LB dressing   Standing balance support: Bilateral upper extremity supported;During functional activity Standing balance-Leahy Scale: Fair Standing balance comment: MIN GUARD to walk ~66ft with RW                           ADL either performed or assessed with clinical judgement   ADL Overall ADL's : Needs assistance/impaired                     Lower Body Dressing: Maximal assistance;Sitting/lateral leans Lower Body Dressing Details (indicate cue type and reason): MAX A to don socks               General ADL Comments: Independent with bed-level  UB ADLs. Anticipate MAX A for all seated and standing ADLs d/t pt requiring bilateral UE support during static sitting and standing     Vision Patient Visual Report: No change from baseline              Pertinent Vitals/Pain Pain Assessment: 0-10 Pain Location: 2/10 rest, 10/10; 8/10 walking        Extremity/Trunk Assessment Upper  Extremity Assessment Upper Extremity Assessment: Generalized weakness   Lower Extremity Assessment Lower Extremity Assessment: Generalized weakness          Cognition Arousal/Alertness: Awake/alert Behavior During Therapy: WFL for tasks assessed/performed Overall Cognitive Status: Within Functional Limits for tasks assessed                                        Exercises Other Exercises Other Exercises: Pt instructed in polar care mgt, falls prevention strategies, and home/routines modifications.        Home Living Family/patient expects to be discharged to:: Private residence Living Arrangements: Parent;Other (Comment) (Siblings) Available Help at Discharge: Family;Available 24 hours/day Type of Home: House Home Access: Stairs to enter [employer] of Steps: 5 Entrance Stairs-Rails: Right;Left Home Layout: Two level;Able to live on main level with bedroom/bathroom     Bathroom Shower/Tub: Producer, television/film/video: Handicapped height     Home Equipment: Other (comment);Crutches;Walker - 2 wheels;Hand held shower head;Grab bars - tub/shower (suction cup grab bars, sit>stand recliner)          Prior Functioning/Environment Level of Independence: Independent        Comments: Independent with ADLs. Pt works and drives. Used crutches following knee injury, no AD before        OT Problem List: Decreased strength;Decreased range of motion;Decreased activity tolerance;Impaired balance (sitting and/or standing);Pain      OT Treatment/Interventions: Self-care/ADL training;Therapeutic exercise;Energy conservation;DME and/or AE instruction;Therapeutic activities;Patient/family education;Balance training    OT Goals(Current goals can be found in the care plan section) Acute Rehab OT Goals Patient Stated Goal: Return to home, regain strength OT Goal Formulation: With patient/family Time For Goal Achievement: 03/06/21 Potential to  Achieve Goals: Good ADL Goals Pt Will Perform Grooming: with set-up;with supervision;sitting Pt Will Transfer to Toilet: with min assist;stand pivot transfer;bedside commode Pt Will Perform Toileting - Clothing Manipulation and hygiene: with mod assist;sitting/lateral leans  OT Frequency: Min 3X/week           Co-evaluation PT/OT/SLP Co-Evaluation/Treatment: Yes Reason for Co-Treatment: For patient/therapist safety;To address functional/ADL transfers PT goals addressed during session: Mobility/safety with mobility;Balance;Proper use of DME OT goals addressed during session: ADL's and self-care;Proper use of [employer] equipment and DME      AM-PAC OT "6 Clicks" Daily Activity     Outcome Measure Help from another person eating meals?: None Help from another person taking care of personal grooming?: A Little Help from another person toileting, which includes using toliet, bedpan, or urinal?: A Lot Help from another person bathing (including washing, rinsing, drying)?: A Lot Help from another person to put on and taking off regular upper body clothing?: A Little Help from another person to put on and taking off regular lower body clothing?: A Lot 6 Click Score: 16   End of Session Equipment Utilized During Treatment: Gait belt;Rolling walker;Left knee immobilizer Nurse Communication: Mobility status  Activity Tolerance: Patient tolerated treatment well Patient left: in bed;with call bell/phone within reach;Other (comment) (with  PT)  OT Visit Diagnosis: Unsteadiness on feet (R26.81);Muscle weakness (generalized) (M62.81)                Time: 7829-5621 OT Time Calculation (min): 47 min Charges:  OT General Charges $OT Visit: 1 Visit OT Evaluation $OT Eval Moderate Complexity: 1 Mod OT Treatments $Self Care/Home Management : 8-22 mins $Therapeutic Activity: 8-22 mins  Matthew Folks, OTR/L [facility] [phone]

## 2021-02-20 NOTE — Discharge Summary (Addendum)
Physician Discharge Summary  Subjective: 3 Days Post-Op Procedure(s) (LRB): Left knee arthroscopy and chondroplasty with loose body removal, MPFL reconstruction using allograft, and tibial tubercle osteotomy - RNFA needed (Left) Patient reports pain as mild.   Patient seen in rounds with Dr. Allena Katz. Patient is well, and has had no acute complaints or problems Patient is ready to go home with home health physical therapy after receiving physical therapy this morning.  Physician Discharge Summary  Patient ID: Todd Ward MRN: [id number] DOB/AGE: Aug 27, 2001 20 y.o.  Admit date: 02/19/2021 Discharge date: 02/22/2021  Admission Diagnoses:  Discharge Diagnoses:  Active Problems:   Patellar dislocation   Discharged Condition: fair  Hospital Course: The patient is postop day 1 from a left knee arthroscopy with chondroplasty and loose body removal with MPFL reconstruction and tibial tubercle osteotomy.  The patient is doing well since surgery.  The patient is ready to do physical therapy this morning.  The patient had his drain removed this morning.  The patient is supposed to go to rehab after doing physical therapy and having difficulty with stairs and ambulation.  However, he has not excepted to any rehab facility.  Additionally, no home health care agency would be willing to accept him as a patient.  We had an extensive discussion with patient safety going in and out of the house and the family was going to make accommodations such that he would not have to navigate stairs in the event of an emergency.  Treatments: surgery:  1. Left knee tibial tubercle osteotomy 2. Left knee medial patellofemoral ligament reconstruction using allograft 3. Left knee open lateral retinacular release 4. Left knee arthroscopic chondroplasty of trochlea   SURGEON:Takyia Sindt Molinda Bailiff, MD   ASSISTANT(S): Dedra Skeens, PA   ANESTHESIA: Gen + regional anesthesia   TOTAL IV FLUIDS: see anesthesia record   ESTIMATED  BLOOD LOSS: 300cc   TOURNIQUET TIME: 90 min   DRAINS: medium 10 Fr Hemovac   SPECIMENS: None.   IMPLANTS:  - 4.66mm cortical screws x 3 - Qfix double loaded suture anchor x 2 - Qfix Mini suture anchors x 1 - Semitendinosus allograft 18mm    COMPLICATIONS: None apparent.  Discharge Exam: Blood pressure 122/71, pulse 94, temperature 98.7 F (37.1 C), resp. rate 17, height 6\' 3"  (1.905 m), weight (!) 215.9 kg, SpO2 96 %.   Disposition: Discharge disposition: 01-Home or Self Care         Allergies as of 02/22/2021   No Known Allergies      Medication List     TAKE these medications    albuterol 108 (90 Base) MCG/ACT inhaler Commonly known as: VENTOLIN HFA Inhale 2 puffs into the lungs every 6 (six) hours as needed for wheezing or shortness of breath.   aspirin 325 MG EC tablet Take 1 tablet (325 mg total) by mouth daily.   methocarbamol 500 MG tablet Commonly known as: ROBAXIN Take 1 tablet (500 mg total) by mouth every 6 (six) hours as needed for up to 20 doses for muscle spasms.   ondansetron 4 MG tablet Commonly known as: ZOFRAN Take 1 tablet (4 mg total) by mouth every 6 (six) hours as needed for nausea.   oxyCODONE 5 MG immediate release tablet Commonly known as: Oxy IR/ROXICODONE Take 1-2 tablets (5-10 mg total) by mouth every 4 (four) hours as needed for moderate pain (pain score 4-6).   traMADol 50 MG tablet Commonly known as: ULTRAM Take 1 tablet (50 mg total) by mouth every 6 (  six) hours as needed for moderate pain.               Durable Medical Equipment  (From admission, onward)           Start     Ordered   02/21/21 1126  For home use only DME Walker rolling  Once       Comments: Bariatric over 450 Lbs  Question Answer Comment  Walker: With 5 Inch Wheels   Patient needs a walker to treat with the following condition Unable to bear weight   Patient needs a walker to treat with the following condition Weakness      02/21/21 1125    02/21/21 1055  For home use only DME standard manual wheelchair with seat cushion  Once       Comments: Patient suffers from postoperative nonweightbearing precautions after surgery which impairs their ability to perform daily activities like bathing, dressing, and grooming in the home.  A cane, crutch, or walker will not resolve issue with performing activities of daily living. A wheelchair will allow patient to safely perform daily activities. Patient can safely propel the wheelchair in the home or has a caregiver who can provide assistance. Length of need 6 months . Accessories: elevating leg rests (ELRs), wheel locks, extensions and anti-tippers.  Patient will need bariatric wheelchair.   02/21/21 1055            Follow-up Information     Signa Kell, MD. Go on 03/07/2021.   Specialty: Orthopedic Surgery Why: @ 1:15 pm Contact information: [address] [place] [address] [phone]                 Signed: Lenard Forth, TODD 02/22/2021, 6:46 AM   Objective: Vital signs in last 24 hours: Temp:  [98.1 F (36.7 C)-98.7 F (37.1 C)] 98.7 F (37.1 C) (05/05 0356) Pulse Rate:  [86-99] 94 (05/05 0356) Resp:  [16-17] 17 (05/05 0356) BP: (121-141)/(58-79) 122/71 (05/05 0356) SpO2:  [96 %-100 %] 96 % (05/05 0356)  Intake/Output from previous day:  Intake/Output Summary (Last 24 hours) at 02/22/2021 0646 Last data filed at 02/22/2021 0029 Gross per 24 hour  Intake --  Output 1200 ml  Net -1200 ml    Intake/Output this shift: Total I/O In: -  Out: 600 [Urine:600]  Labs: Recent Labs    02/20/21 0527  HGB 12.1*   Recent Labs    02/20/21 0527  WBC 12.2*  RBC 4.40  HCT 36.0*  PLT 251   No results for input(s): NA, K, CL, CO2, BUN, CREATININE, GLUCOSE, CALCIUM in the last 72 hours. No results for input(s): LABPT, INR in the last 72 hours.  EXAM: General - Patient is Alert and Oriented Extremity - Neurovascular intact Sensation intact  distally Intact pulses distally Compartment soft Incision - clean, dry, with no drainage. Motor Function -plantarflexion and dorsiflexion of his foot and toes intact.  Assessment/Plan: 3 Days Post-Op Procedure(s) (LRB): Left knee arthroscopy and chondroplasty with loose body removal, MPFL reconstruction using allograft, and tibial tubercle osteotomy - RNFA needed (Left) Procedure(s) (LRB): Left knee arthroscopy and chondroplasty with loose body removal, MPFL reconstruction using allograft, and tibial tubercle osteotomy - RNFA needed (Left) Past Medical History:  Diagnosis Date   Diabetes mellitus without complication (HCC)    No meds-Last A1C in 2020 was 5.4   Elevated liver enzymes    Family history of adverse reaction to anesthesia    PTS TWIN BROTHER-HARD TIME TO WAKE UP  Hyperlipidemia    Hypertension    off meds x 3-4 years   Morbid obesity (HCC)    Normocytic normochromic anemia    Obesity    Active Problems:   Patellar dislocation  Estimated body mass index is 59.5 kg/m as calculated from the following:   Height as of this encounter: 6\' 3"  (1.905 m).   Weight as of this encounter: 215.9 kg. Advance diet Up with therapy D/C IV fluids Discharge home with home health Diet - Regular diet Follow up - in 2 weeks Activity - NWB Disposition -Home  Condition Upon Discharge - Stable DVT Prophylaxis - Aspirin  , PA-C Orthopaedic Surgery 02/22/2021, 6:46 AM

## 2021-02-20 NOTE — Progress Notes (Signed)
  Subjective: 1 Day Post-Op Procedure(s) (LRB): Left knee arthroscopy and chondroplasty with loose body removal, MPFL reconstruction using allograft, and tibial tubercle osteotomy - RNFA needed (Left) Patient reports pain as mild.   Patient is well, and has had no acute complaints or problems Plan is to go Home after hospital stay. Negative for chest pain and shortness of breath Fever: no Gastrointestinal: Give for nausea and vomiting  Objective: Vital signs in last 24 hours: Temp:  [96.8 F (36 C)-98.6 F (37 C)] 98.4 F (36.9 C) (05/03 0415) Pulse Rate:  [71-105] 71 (05/03 0415) Resp:  [13-28] 18 (05/03 0415) BP: (118-156)/(61-95) 125/61 (05/03 0415) SpO2:  [91 %-100 %] 96 % (05/03 0415) Weight:  [215.9 kg] 215.9 kg (05/02 0617)  Intake/Output from previous day:  Intake/Output Summary (Last 24 hours) at 02/20/2021 0555 Last data filed at 02/20/2021 0337 Gross per 24 hour  Intake 2572.85 ml  Output 1710 ml  Net 862.85 ml    Intake/Output this shift: Total I/O In: -  Out: 870 [Urine:800; Drains:70]  Labs: No results for input(s): HGB in the last 72 hours. No results for input(s): WBC, RBC, HCT, PLT in the last 72 hours. No results for input(s): NA, K, CL, CO2, BUN, CREATININE, GLUCOSE, CALCIUM in the last 72 hours. No results for input(s): LABPT, INR in the last 72 hours.   EXAM General - Patient is Alert and Oriented Extremity - Neurovascular intact Sensation intact distally Compartment soft Dressing/Incision - clean, dry, with the Hemovac removed with no complication. Motor Function - intact, moving foot and toes well on exam.   Past Medical History:  Diagnosis Date  . Diabetes mellitus without complication (HCC)    No meds-Last A1C in 2020 was 5.4  . Elevated liver enzymes   . Family history of adverse reaction to anesthesia    PTS TWIN BROTHER-HARD TIME TO WAKE UP  . Hyperlipidemia   . Hypertension    off meds x 3-4 years  . Morbid obesity (HCC)   .  Normocytic normochromic anemia   . Obesity     Assessment/Plan: 1 Day Post-Op Procedure(s) (LRB): Left knee arthroscopy and chondroplasty with loose body removal, MPFL reconstruction using allograft, and tibial tubercle osteotomy - RNFA needed (Left) Active Problems:   Patellar dislocation  Estimated body mass index is 59.5 kg/m as calculated from the following:   Height as of this encounter: 6\' 3"  (1.905 m).   Weight as of this encounter: 215.9 kg. Advance diet Up with therapy D/C IV fluids Discharge home with home health  DVT Prophylaxis - Aspirin None Weight-Bearing  to left leg  , PA-C Orthopaedic Surgery 02/20/2021, 5:55 AM

## 2021-02-20 NOTE — Evaluation (Addendum)
Physical Therapy Evaluation Patient Details Name: Todd Ward MRN: [id number] DOB: Mar 07, 2001 Today's Date: 02/20/2021   History of Present Illness  Ab Leaming is a [place] Male who presents after elective Left knee arthroscopy and chondroplasty with loose body removal, MPFL reconstruction using allograft, and tibial tubercle osteotomy. 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  Clinical Impression  Pt admitted with above diagnosis. Pt currently with functional limitations due to the deficits listed below (see "PT Problem List"). Upon entry, pt in bed, awake and agreeable to participate, finishing up with occupational therapy evlauation. The pt is alert, pleasant, interactive, and able to provide info regarding prior level of function, both in tolerance and independence. Pt has increased pain with sitting/standing up 8/10 -10/10. Pt requires modA +2 for basic mobility, eventually able to transfer without assist from elevated surface, but AMB is limited to ~30ft bouts due to fatigue and pain. Pt unable to ascend 1 step despite time and effort learning this task, limited mostly by insufficient strength in BUE. Pt has 5 steps to enter his home. Pt educated on use of polar care and knee brace. Brace is too small and only fits when drifted inferior by a few inches. Orthopedist is aware and addressing. Brace will work as immobilizer at present, but will need to be careful to assure strap is not directly over the patella. Patient's performance this date reveals decreased ability, independence, and tolerance in performing all basic mobility required for performance of activities of daily living. Pt requires additional DME, close physical assistance, and cues for safe participate in mobility. Pt will benefit from skilled PT intervention to increase independence and safety with basic mobility in preparation for discharge to the venue listed below.       Follow Up Recommendations Supervision for  mobility/OOB;CIR (Does not have HHPT benefit; unable to manage home entry at present for medical followup)    Equipment Recommendations  Other (comment) (Bariatric RW; [facility]; needs a new knee brace correct fit (ortho working on it))    Recommendations for Other Services       Precautions / Restrictions Precautions Precautions: Fall;Knee Required Braces or Orthoses: Knee Immobilizer - Left Knee Immobilizer - Left: On at all times Restrictions Weight Bearing Restrictions: Yes LLE Weight Bearing: Non weight bearing      Mobility  Bed Mobility Overal bed mobility: Needs Assistance Bed Mobility: Supine to Sit;Sit to Supine     Supine to sit: Mod assist Sit to supine: Mod assist;+2 for physical assistance   General bed mobility comments: help with trunk and operative limb    Transfers Overall transfer level: Needs assistance Equipment used: Rolling walker (2 wheeled) Transfers: Sit to/from Stand Sit to Stand: From elevated surface         General transfer comment: Bari RW: minGuard from elevtaed surface  Ambulation/Gait   Gait Distance (Feet): 12 Feet Assistive device:  (BariRW) Gait Pattern/deviations: Step-to pattern (RLE hop-to gait pattern, stpe s<10 inches, vertical excursion quite poor, reflects weakness of arms bilat.)        Stairs Stairs: Yes   Stair Management: Backwards;With walker Number of Stairs: 0 General stair comments: with bariRW, unable to achive sufficient lift with BUE to get foot onto step, 3 attempts, and  RW adjustment  Wheelchair Mobility    Modified Rankin (Stroke Patients Only)       Balance Overall balance assessment: Modified Independent;Mild deficits observed, not formally tested  Pertinent Vitals/Pain Pain Assessment: 0-10 Pain Location: 2/10 rest, 10/10; 8/10 walking    Home Living Family/patient expects to be discharged to:: Private residence Living  Arrangements: Parent;Other (Comment) (Siblings) Available Help at Discharge: Family;Available 24 hours/day Type of Home: House Home Access: Stairs to enter Entrance Stairs-Rails: Doctor, general practice of Steps: 5 Home Layout: Two level;Able to live on main level with bedroom/bathroom Home Equipment: Other (comment);Crutches;Walker - 2 wheels;Hand held shower head;Grab bars - tub/shower (suction cup grab bars, sit>stand recliner)      Prior Function Level of Independence: Independent         Comments: Independent with ADLs. Pt works and drives. Used crutches following knee injury, no AD before     Hand Dominance        Extremity/Trunk Assessment   Upper Extremity Assessment Upper Extremity Assessment: Generalized weakness    Lower Extremity Assessment Lower Extremity Assessment: Generalized weakness;Overall WFL for tasks assessed       Communication   Communication: No difficulties  Cognition Arousal/Alertness: Awake/alert Behavior During Therapy: WFL for tasks assessed/performed Overall Cognitive Status: Within Functional Limits for tasks assessed                                        General Comments      Exercises     Assessment/Plan    PT Assessment Patient needs continued PT services  PT Problem List Decreased strength;Decreased range of motion;Decreased activity tolerance;Decreased balance;Decreased mobility;Decreased knowledge of precautions;Decreased knowledge of use of DME;Decreased safety awareness       PT Treatment Interventions DME instruction;Therapeutic exercise;Gait training;Stair training;Functional mobility training;Therapeutic activities;Balance training;Patient/family education    PT Goals (Current goals can be found in the Care Plan section)  Acute Rehab PT Goals Patient Stated Goal: Return to home, regain strength PT Goal Formulation: With patient Time For Goal Achievement: 03/06/21 Potential to Achieve  Goals: Good    Frequency BID   Barriers to discharge Inaccessible home environment;Decreased caregiver support      Co-evaluation PT/OT/SLP Co-Evaluation/Treatment: Yes Reason for Co-Treatment: For patient/therapist safety;To address functional/ADL transfers PT goals addressed during session: Mobility/safety with mobility;Balance;Proper use of DME OT goals addressed during session: Proper use of Adaptive equipment and DME;ADL's and self-care       AM-PAC PT "6 Clicks" Mobility  Outcome Measure Help needed turning from your back to your side while in a flat bed without using bedrails?: A Lot Help needed moving from lying on your back to sitting on the side of a flat bed without using bedrails?: A Lot Help needed moving to and from a bed to a chair (including a wheelchair)?: Total Help needed standing up from a chair using your arms (e.g., wheelchair or bedside chair)?: Total Help needed to walk in hospital room?: Total Help needed climbing 3-5 steps with a railing? : Total 6 Click Score: 8    End of Session Equipment Utilized During Treatment: Gait belt;Left knee immobilizer Activity Tolerance: Patient tolerated treatment well;No increased pain Patient left: in bed;with call bell/phone within reach;with family/visitor present Nurse Communication: Mobility status;Patient requests pain meds PT Visit Diagnosis: Unsteadiness on feet (R26.81);Other abnormalities of gait and mobility (R26.89);Difficulty in walking, not elsewhere classified (R26.2)    Time: 7096-2836 PT Time Calculation (min) (ACUTE ONLY): 54 min   Charges:   PT Evaluation $PT Eval Moderate Complexity: 1 Mod PT Treatments $Gait Training: 8-22 mins $Self Care/Home Management: 8-22  12:49 PM, 02/20/21 Rosamaria Lints, PT, DPT Physical Therapist - [facility]  [phone] (ASCOM)    Nasiya Pascual C 02/20/2021, 12:44 PM

## 2021-02-20 NOTE — TOC Progression Note (Signed)
Transition of Care [facility]) - Progression Note    Patient Details  Name: Todd Ward MRN: [id number] Date of Birth: March 28, 2001  [facility] CM/SW Contact  Barrie Dunker, RN Phone Number: 02/20/2021, 4:11 PM  Clinical Narrative:   Patient is open to going to rehab, PASSR obtained, bedsearch sent   Expected Discharge Plan: Home w Home Health Services Barriers to Discharge: Continued Medical Work up  Expected Discharge Plan and Services Expected Discharge Plan: Home w Home Health Services   Discharge Planning Services: CM Consult   Living arrangements for the past 2 months: Single Family Home Expected Discharge Date: 02/20/21               DME Arranged: 3-N-1 DME Agency: [employer] Date DME Agency Contacted: 02/20/21 Time DME Agency Contacted: [phone] Representative spoke with at DME Agency: Bjorn Loser HH Arranged: PT HH Agency: [facility] Date HH Agency Contacted: 02/20/21 Time HH Agency Contacted: [phone] Representative spoke with at [facility] Agency: [place]   Social Determinants of Health (SDOH) Interventions    Readmission Risk Interventions No flowsheet data found.

## 2021-02-20 NOTE — [place] FL2 (Signed)
[place] MEDICAID [id number] LEVEL OF CARE SCREENING TOOL     IDENTIFICATION  Patient Name: Todd Ward Birthdate: [birth year] Sex: male Admission Date (Current Location): 02/19/2021  [place] and [place] Number:  Chiropodist and Address:  [facility], [address], [place], [place] [address]      Provider Number: [id number]  Attending Physician Name and Address:  Signa Kell, MD  Relative Name and Phone Number:  Teddy Spike [phone]    Current Level of Care: Hospital Recommended Level of Care: Skilled Nursing Facility Prior Approval Number:    Date Approved/Denied:   PASRR Number: [id number]  Discharge Plan: SNF    Current Diagnoses: Patient Active Problem List   Diagnosis Date Noted  . Patellar dislocation 02/19/2021    Orientation RESPIRATION BLADDER Height & Weight     Self,Time,Situation,Place  Normal Continent Weight: (!) 215.9 kg Height:  6\' 3"  (190.5 cm)  BEHAVIORAL SYMPTOMS/MOOD NEUROLOGICAL BOWEL NUTRITION STATUS      Continent Diet (regular)  AMBULATORY STATUS COMMUNICATION OF NEEDS Skin   Extensive Assist Verbally Surgical wounds                       Personal Care Assistance Level of Assistance  Dressing,Bathing Bathing Assistance: Limited assistance   Dressing Assistance: Limited assistance     Functional Limitations Info             SPECIAL CARE FACTORS FREQUENCY  PT (By licensed PT)     PT Frequency: 5 times per week              Contractures Contractures Info: Not present    Additional Factors Info  Code Status,Allergies Code Status Info: Full code Allergies Info: NKDA           Current Medications (02/20/2021):  This is the current hospital active medication list Current Facility-Administered Medications  Medication Dose Route Frequency Provider Last Rate Last Admin  . 0.9 %  sodium chloride infusion   Intravenous Continuous 04/22/2021, MD 75 mL/hr at 02/19/21 1455 New Bag  at 02/19/21 1455  . albuterol (VENTOLIN HFA) 108 (90 Base) MCG/ACT inhaler 2 puff  2 puff Inhalation Q6H PRN 04/21/21, MD      . aspirin EC tablet 325 mg  325 mg Oral Daily Signa Kell, MD   325 mg at 02/20/21 0847  . diphenhydrAMINE (BENADRYL) 12.5 MG/5ML elixir 12.5-25 mg  12.5-25 mg Oral Q4H PRN 11-20-1984, MD      . docusate sodium (COLACE) capsule 100 mg  100 mg Oral BID Signa Kell, MD   100 mg at 02/20/21 0846  . HYDROmorphone (DILAUDID) injection 0.5-1 mg  0.5-1 mg Intravenous Q4H PRN 04/22/21, MD   1 mg at 02/20/21 1041  . methocarbamol (ROBAXIN) tablet 500 mg  500 mg Oral Q6H PRN 04/22/21, MD   500 mg at 02/20/21 0846   Or  . methocarbamol (ROBAXIN) 500 mg in dextrose 5 % 50 mL IVPB  500 mg Intravenous Q6H PRN 04/22/21, MD      . ondansetron [facility]) tablet 4 mg  4 mg Oral Q6H PRN [facility], MD       Or  . ondansetron [facility]) injection 4 mg  4 mg Intravenous Q6H PRN [facility], MD      . oxyCODONE (Oxy IR/ROXICODONE) immediate release tablet 10-15 mg  10-15 mg Oral Q4H PRN Signa Kell, MD   10 mg at 02/20/21 0319  .  oxyCODONE (Oxy IR/ROXICODONE) immediate release tablet 5-10 mg  5-10 mg Oral Q4H PRN Signa Kell, MD   10 mg at 02/20/21 1525  . senna-docusate (Senokot-S) tablet 1 tablet  1 tablet Oral QHS PRN Signa Kell, MD      . traMADol Janean Sark) tablet 50 mg  50 mg Oral Q6H PRN Signa Kell, MD   50 mg at 02/19/21 1954     Discharge Medications: Please see discharge summary for a list of discharge medications.  Relevant Imaging Results:  Relevant Lab Results:   Additional Information SS# [id number]  Barrie Dunker, RN

## 2021-02-21 DIAGNOSIS — S8332XA Tear of articular cartilage of left knee, current, initial encounter: Secondary | ICD-10-CM | POA: Diagnosis not present

## 2021-02-21 NOTE — Progress Notes (Signed)
  Subjective: 2 Days Post-Op Procedure(s) (LRB): Left knee arthroscopy and chondroplasty with loose body removal, MPFL reconstruction using allograft, and tibial tubercle osteotomy - RNFA needed (Left) Patient reports pain as mild.   Patient is well, and has had no acute complaints or problems Plan is to go Home versus rehab after hospital stay. Negative for chest pain and shortness of breath Fever: no Gastrointestinal: Give for nausea and vomiting  Objective: Vital signs in last 24 hours: Temp:  [98 F (36.7 C)-98.3 F (36.8 C)] 98.3 F (36.8 C) (05/04 0540) Pulse Rate:  [79-103] 87 (05/04 0540) Resp:  [16-18] 18 (05/04 0540) BP: (112-123)/(54-65) 119/54 (05/04 0540) SpO2:  [95 %-100 %] 100 % (05/04 0540)  Intake/Output from previous day:  Intake/Output Summary (Last 24 hours) at 02/21/2021 0655 Last data filed at 02/20/2021 1549 Gross per 24 hour  Intake --  Output 400 ml  Net -400 ml    Intake/Output this shift: No intake/output data recorded.  Labs: Recent Labs    02/20/21 0527  HGB 12.1*   Recent Labs    02/20/21 0527  WBC 12.2*  RBC 4.40  HCT 36.0*  PLT 251   No results for input(s): NA, K, CL, CO2, BUN, CREATININE, GLUCOSE, CALCIUM in the last 72 hours. No results for input(s): LABPT, INR in the last 72 hours.   EXAM General - Patient is Alert and Oriented Extremity - Neurovascular intact Sensation intact distally Compartment soft Dressing/Incision - clean, dry, and no drainage Motor Function - intact, moving foot and toes well on exam.  Ambulated 16 feet including stairs with physical therapy.  Past Medical History:  Diagnosis Date  . Diabetes mellitus without complication (HCC)    No meds-Last A1C in 2020 was 5.4  . Elevated liver enzymes   . Family history of adverse reaction to anesthesia    PTS TWIN BROTHER-HARD TIME TO WAKE UP  . Hyperlipidemia   . Hypertension    off meds x 3-4 years  . Morbid obesity (HCC)   . Normocytic normochromic  anemia   . Obesity     Assessment/Plan: 2 Days Post-Op Procedure(s) (LRB): Left knee arthroscopy and chondroplasty with loose body removal, MPFL reconstruction using allograft, and tibial tubercle osteotomy - RNFA needed (Left) Active Problems:   Patellar dislocation  Estimated body mass index is 59.5 kg/m as calculated from the following:   Height as of this encounter: 6\' 3"  (1.905 m).   Weight as of this encounter: 215.9 kg. Advance diet Continue physical therapy Discharge planning to rehab based on physical therapy recommendation. Follow-up with [facility] clinic orthopedics in 2 weeks with Dr. [facility].  DVT Prophylaxis - Aspirin None Weight-Bearing  to left leg  Allena Katz, PA-C Orthopaedic Surgery 02/21/2021, 6:55 AM

## 2021-02-21 NOTE — Progress Notes (Signed)
Physical Therapy Treatment Patient Details Name: Todd Ward MRN: [id number] DOB: 12-02-2000 Today's Date: 02/21/2021    History of Present Illness Todd Ward is a [place] Male who presents after elective Left knee arthroscopy and chondroplasty with loose body removal, MPFL reconstruction using allograft, and tibial tubercle osteotomy. 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    PT Comments    Pt continues to show good effort, but overall struggles with tolerance, ability to truly sustain NWBing and inability to effectively negotiate steps with available set up at home.  Pt's father present t/o the session and very helpful and open to all education and options to insure recovery.  Pt was able to do a "prolonged"  (45 ft) bout of ambulation with great effort and subsequent fatigue with HR into 170. Pt completely unable to safely negotiate steps, strategy in place and repeatedly discussed and hashed over with family.    Follow Up Recommendations  Supervision for mobility/OOB;SNF     Equipment Recommendations  Other (comment) (bariatic w/c, bariatic walker, ramp)    Recommendations for Other Services       Precautions / Restrictions Precautions Precautions: Fall;Knee Knee Immobilizer - Left: On at all times Restrictions LLE Weight Bearing: Non weight bearing    Mobility  Bed Mobility Overal bed mobility: Needs Assistance Bed Mobility: Supine to Sit     Supine to sit: Min guard     General bed mobility comments: Pt was able to slowly, but w/o direct assist, use his R LE to assist L LE to EOB.  He was very slow in doing this but with great effort and heavy UE use on rails he did manage    Transfers Overall transfer level: Needs assistance Equipment used: Rolling walker (2 wheeled) Transfers: Sit to/from Stand Sit to Stand: From elevated surface;Min assist;Min guard         General transfer comment: Multiple sit <> stand bouts today.  Pt was able to rise  from a standard height seat for the first time today, heavy cuing and UE use.  PT showed great effort, despite being fatigued and having to really focus during all attempts  Ambulation/Gait Ambulation/Gait assistance: Min assist Gait Distance (Feet): 45 Feet Assistive device: Rolling walker (2 wheeled)       General Gait Details: First bout of ambulation we used bariatric crutches, he appeared to keep weight off L LE realtively, he would fatigue after a few steps and lean on axillary rests and then take a few more step-to hops.  As he fatigued he became more and more fatigued and had a stagger, near LOB needing direct assist to insure he did not pitch forward and indeed was able to get back into the recliner.  HR was ~170 after this effort.  2 more short bouts of ambulation (~10 ft and 15 ft) with [place] walker.  With this AD he was able to truly maintain NWBing but fatigued very quickly, again with HR in the 170 range after these efforts.   Stairs         General stair comments: attempted steps today w/o the use of hand rails (trying to use walker to elevate).  Pt was unable to even get close unless he started to put weight on the L.  This is not a viable option, discussed this with pt's father who reports he has a plan that has worked in the past (with a bariatric walker) with a ramp set up.   Wheelchair Mobility  Modified Rankin (Stroke Patients Only)       Balance Overall balance assessment: Needs assistance Sitting-balance support: Bilateral upper extremity supported;Feet supported Sitting balance-Leahy Scale: Good     Standing balance support: Bilateral upper extremity supported;During functional activity Standing balance-Leahy Scale: Fair Standing balance comment: Pt highly reliant on the walker, quick to fatigue with any standing and better able maintain NWBing on L                            Cognition Arousal/Alertness: Awake/alert Behavior During Therapy:  WFL for tasks assessed/performed Overall Cognitive Status: Within Functional Limits for tasks assessed                                        Exercises General Exercises - Lower Extremity Ankle Circles/Pumps: AROM;10 reps Quad Sets: Strengthening;15 reps Hip ABduction/ADduction: AAROM;10 reps;Strengthening    General Comments General comments (skin integrity, edema, etc.): Pt motivated but still very functionally limited, with PT's biggest concern being ability to sustain appropriate WBing over time with increased upright activity.      Pertinent Vitals/Pain Pain Assessment: 0-10 Pain Score: 6     Home Living                      Prior Function            PT Goals (current goals can now be found in the care plan section) Progress towards PT goals: Progressing toward goals    Frequency    BID      PT Plan Current plan remains appropriate    Co-evaluation              AM-PAC PT "6 Clicks" Mobility   Outcome Measure  Help needed turning from your back to your side while in a flat bed without using bedrails?: A Little Help needed moving from lying on your back to sitting on the side of a flat bed without using bedrails?: A Lot Help needed moving to and from a bed to a chair (including a wheelchair)?: Total Help needed standing up from a chair using your arms (e.g., wheelchair or bedside chair)?: Total Help needed to walk in hospital room?: Total Help needed climbing 3-5 steps with a railing? : Total 6 Click Score: 9    End of Session Equipment Utilized During Treatment: Gait belt;Left knee immobilizer Activity Tolerance: Patient limited by fatigue Patient left: with chair alarm set;with call bell/phone within reach Nurse Communication: Mobility status;Patient requests pain meds PT Visit Diagnosis: Unsteadiness on feet (R26.81);Other abnormalities of gait and mobility (R26.89);Difficulty in walking, not elsewhere classified (R26.2)      Time: 7846-9629 PT Time Calculation (min) (ACUTE ONLY): 78 min  Charges:  $Gait Training: 38-52 mins $Therapeutic Exercise: 8-22 mins $Therapeutic Activity: 8-22 mins                     Todd Ward, DPT 02/21/2021, 1:25 PM

## 2021-02-21 NOTE — Progress Notes (Signed)
Physical Therapy Treatment Patient Details Name: Todd Ward MRN: [id number] DOB: 2001-09-17 Today's Date: 02/21/2021    History of Present Illness Todd Ward is a [place] Male who presents after elective Left knee arthroscopy and chondroplasty with loose body removal, MPFL reconstruction using allograft, and tibial tubercle osteotomy. 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    PT Comments    Pt was supine in bed upon arriving. He agrees to session with encouragement. Does endorse less pain and feeling a little better after getting some rest. 4/10 pain. He was able to exit L side of bed with CGA assist for tactile cues for sequencing and improved technique. Stood from elevated bed height with CGA but required min assist from lower w/c height. Pt was able to adhere to NWB from elevated surface but unable to from lower surface. Once in standing, tolerated "hopping" using bariatric RW to doorway of room. W/c follow for safety. HR elevated to 158 bpm but pt reports no symptoms or fatigue. " I know I will do better after a good nights sleep." Pt/pt' family educated on brace and need to maintaining NWB for proper healing. Pt and family are planning to DC home tomorrow. Recommend continued skilled PT to address deficits while maximizing independence. CM informed that bariatric WC did not have elevating leg rest and that pt is unable to use in current state. Brace had to be adjusted to proper fit once pt was in bed post ambulation. He was in bed at conclusion of session with call bell in reach, polar care running and bed alarm in place.     Follow Up Recommendations  Supervision for mobility/OOB;SNF     Equipment Recommendations  Other (comment) (Pt has bariatric RW and w/c in room however will need elevating leg rest for w/c due to pt's L knee being immobilizer/locked in ext.)       Precautions / Restrictions Precautions Precautions: Fall;Knee Precaution Booklet Issued: No Knee  Immobilizer - Left: On at all times Restrictions Weight Bearing Restrictions: Yes LLE Weight Bearing: Non weight bearing    Mobility  Bed Mobility Overal bed mobility: Needs Assistance Bed Mobility: Supine to Sit     Supine to sit: Min guard Sit to supine: Min assist;Mod assist   General bed mobility comments: Pt requires increased time to progress from    Transfers Overall transfer level: Needs assistance Equipment used: Rolling walker (2 wheeled) Transfers: Sit to/from Stand Sit to Stand: From elevated surface;Min guard;Min assist         General transfer comment: CGA for safety from elevated bed height. Min assist from lower w/c surface. Pt was struggles with NWB with standing from w/c height. recommended elvated surface height with pillows in future sessions.  Ambulation/Gait Ambulation/Gait assistance: Min guard Gait Distance (Feet): 25 Feet Assistive device: Rolling walker (2 wheeled)   Gait velocity: decrease frequent standing rest breaks   General Gait Details: Pt was able to "hop" to doorway of room with HR elevation to 158bpm. He states," This is better than I did this morning." Pt/pt's mon/dad feel state that they feel confident they can manage at home   Stairs         General stair comments: attempted steps today w/o the use of hand rails (trying to use walker to elevate).  Pt was unable to even get close unless he started to put weight on the L.  This is not a viable option, discussed this with pt's father who reports he has a  plan that has worked in the past (with a bariatric walker) with a ramp set up.      Balance Overall balance assessment: Needs assistance Sitting-balance support: Bilateral upper extremity supported;Feet supported Sitting balance-Leahy Scale: Good     Standing balance support: Bilateral upper extremity supported;During functional activity Standing balance-Leahy Scale: Fair Standing balance comment: Pt highly reliant on the  walker, quick to fatigue with any standing and better able maintain NWBing on L         Cognition Arousal/Alertness: Awake/alert Behavior During Therapy: WFL for tasks assessed/performed Overall Cognitive Status: Within Functional Limits for tasks assessed    General Comments: Pt is alert and cooperative      Exercises General Exercises - Lower Extremity Ankle Circles/Pumps: AROM;10 reps Quad Sets: Strengthening;15 reps Hip ABduction/ADduction: AAROM;10 reps;Strengthening    General Comments General comments (skin integrity, edema, etc.): therapist adjusted and educated family on brace. recommended continued polar care t home as much as possible. Pqrenets will be home to assist pt at DC      Pertinent Vitals/Pain Pain Assessment: 0-10 Pain Score: 4  Pain Location: LLE Pain Descriptors / Indicators: Discomfort Pain Intervention(s): Limited activity within patient's tolerance;Monitored during session;Premedicated before session;Repositioned;Ice applied           PT Goals (current goals can now be found in the care plan section) Acute Rehab PT Goals Patient Stated Goal: go home when ready Progress towards PT goals: Progressing toward goals    Frequency    BID      PT Plan Current plan remains appropriate    Co-evaluation     PT goals addressed during session: Mobility/safety with mobility;Balance;Proper use of DME;Strengthening/ROM        AM-PAC PT "6 Clicks" Mobility   Outcome Measure  Help needed turning from your back to your side while in a flat bed without using bedrails?: A Little Help needed moving from lying on your back to sitting on the side of a flat bed without using bedrails?: A Lot Help needed moving to and from a bed to a chair (including a wheelchair)?: A Lot Help needed standing up from a chair using your arms (e.g., wheelchair or bedside chair)?: A Lot Help needed to walk in hospital room?: A Lot Help needed climbing 3-5 steps with a  railing? : A Lot 6 Click Score: 13    End of Session Equipment Utilized During Treatment: Gait belt;Left knee immobilizer Activity Tolerance: Patient limited by fatigue Patient left: in bed;with call bell/phone within reach;with bed alarm set;with family/visitor present Nurse Communication: Mobility status;Patient requests pain meds PT Visit Diagnosis: Unsteadiness on feet (R26.81);Other abnormalities of gait and mobility (R26.89);Difficulty in walking, not elsewhere classified (R26.2)     Time: 1536-1610 PT Time Calculation (min) (ACUTE ONLY): 34 min  Charges:  $Gait Training: 8-22 mins $Therapeutic Exercise: 8-22 mins $Therapeutic Activity: 8-22 mins                     Jetta Lout PTA 02/21/21, 4:35 PM

## 2021-02-22 DIAGNOSIS — S8332XA Tear of articular cartilage of left knee, current, initial encounter: Secondary | ICD-10-CM | POA: Diagnosis not present

## 2021-02-22 MED ORDER — MAGNESIUM HYDROXIDE 400 MG/5ML PO SUSP
15.0000 mL | Freq: Every day | ORAL | Status: DC
  Administered 2021-02-22: 15 mL via ORAL
  Filled 2021-02-22: qty 30

## 2021-02-22 NOTE — Progress Notes (Signed)
Occupational Therapy Treatment Patient Details Name: CALEEL KINER MRN: [id number] DOB: [birth year] Today's Date: 02/22/2021    History of present illness Kahil Agner is a [place] Male who presents after elective Left knee arthroscopy and chondroplasty with loose body removal, MPFL reconstruction using allograft, and tibial tubercle osteotomy. 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   OT comments  Mr Harriman was seen for OT treatment on this date. Upon arrival to room pt seated on [facility], father in room reports he assisted with transfer bed>BSC. MIN A + RW for [facility] t/f - assist provided by father in room to demonstrate safe at home transfers. Pt and father instructed in polar care mgmt, falls prevention, and home/routines modifications. Pt making good progress toward goals. Pt continues to benefit from skilled OT services to maximize return to PLOF and minimize risk of future falls, injury, caregiver burden, and readmission. Will continue to follow POC. Discharge recommendation updated.    Follow Up Recommendations  Home health OT;Supervision/Assistance - 24 hour    Equipment Recommendations  3 in 1 bedside commode    Recommendations for Other Services      Precautions / Restrictions Precautions Precautions: Fall;Knee Precaution Booklet Issued: No Required Braces or Orthoses: Knee Immobilizer - Left Knee Immobilizer - Left: On at all times Restrictions Weight Bearing Restrictions: Yes LLE Weight Bearing: Non weight bearing       Mobility Bed Mobility Overal bed mobility: Needs Assistance Bed Mobility: Sit to Supine     Sit to supine: Min assist   General bed mobility comments: Pt's father provided assistance for LLE mgmt    Transfers Overall transfer level: Needs assistance Equipment used: Rolling walker (2 wheeled) Transfers: Sit to/from Stand Sit to Stand: From elevated surface;Min assist         General transfer comment: MIN A sit>stand for low BSC  height, CGA stand>sit to elevated bed height    Balance Overall balance assessment: Needs assistance Sitting-balance support: Bilateral upper extremity supported;Feet supported Sitting balance-Leahy Scale: Good     Standing balance support: Bilateral upper extremity supported;During functional activity Standing balance-Leahy Scale: Fair Standing balance comment: Pt highly reliant on the walker, quick to fatigue with any standing and better able maintain NWBing on L                           ADL either performed or assessed with clinical judgement   ADL Overall ADL's : Needs assistance/impaired                                       General ADL Comments: MIN A + RW for [facility] t/f - assist provided by father in room to demonstrate safe at home transfers.               Cognition Arousal/Alertness: Awake/alert Behavior During Therapy: WFL for tasks assessed/performed Overall Cognitive Status: Within Functional Limits for tasks assessed                                 General Comments: education given to pt's father as pt does not engage once returned to bed        Exercises Exercises: Other exercises Other Exercises Other Exercises: Pt and father instructed in polar care mgt, falls prevention strategies, and home/routines modifications. Other Exercises: Toileting,  sit>sup, sit<>stand, sitting/standing balance/tolerance      General Comments reviewed importance of ther ex, proper positioning, and polar care use at DC to promote strengthening and healing    Pertinent Vitals/ Pain       Pain Assessment: Faces Pain Score: 10-Worst pain ever (with movement) Faces Pain Scale: Hurts even more Pain Location: LLE Pain Descriptors / Indicators: Discomfort;Grimacing Pain Intervention(s): Limited activity within patient's tolerance;Repositioned         Frequency  Min 3X/week        Progress Toward Goals  OT Goals(current goals can  now be found in the care plan section)  Progress towards OT goals: Progressing toward goals  Acute Rehab OT Goals Patient Stated Goal: go home when ready OT Goal Formulation: With patient/family Time For Goal Achievement: 03/06/21 Potential to Achieve Goals: Good ADL Goals Pt Will Perform Grooming: with set-up;with supervision;sitting Pt Will Transfer to Toilet: with min assist;stand pivot transfer;bedside commode Pt Will Perform Toileting - Clothing Manipulation and hygiene: with mod assist;sitting/lateral leans  Plan Frequency remains appropriate;Discharge plan needs to be updated       AM-PAC OT "6 Clicks" Daily Activity     Outcome Measure   Help from another person eating meals?: None Help from another person taking care of personal grooming?: A Little Help from another person toileting, which includes using toliet, bedpan, or urinal?: A Little Help from another person bathing (including washing, rinsing, drying)?: A Lot Help from another person to put on and taking off regular upper body clothing?: A Little Help from another person to put on and taking off regular lower body clothing?: A Lot 6 Click Score: 17    End of Session Equipment Utilized During Treatment: Rolling walker  OT Visit Diagnosis: Unsteadiness on feet (R26.81);Muscle weakness (generalized) (M62.81)   Activity Tolerance Patient tolerated treatment well   Patient Left in bed;with call bell/phone within reach;with family/visitor present   Nurse Communication Mobility status        Time: 1123-1140 OT Time Calculation (min): 17 min  Charges: OT General Charges $OT Visit: 1 Visit OT Treatments $Self Care/Home Management : 8-22 mins  Kathie Dike, M.S. OTR/L  02/22/21, 12:17 PM  ascom [phone]

## 2021-02-22 NOTE — Progress Notes (Signed)
Physical Therapy Treatment Patient Details Name: Todd Ward MRN: [id number] DOB: 28-Jan-2001 Today's Date: 02/22/2021    History of Present Illness Todd Ward is a [place] Male who presents after elective Left knee arthroscopy and chondroplasty with loose body removal, MPFL reconstruction using allograft, and tibial tubercle osteotomy. 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    PT Comments    Pt was supine in bed with RN in room. " I feel a lot better than earlier in the day." 4/10 pain that did not limit session progression. He was able to exit L side of bed, stand to RW and ambulate 45 ft while adhering to NWB restrictions. Once returned to bed, issued there ex and theraband with education on exercises to perform to promote strengthening and return in function. By conclusion of session, bot pt/pt's father state confidence in DC to home this afternoon via EMS. Pt has all required equipment needs met. Will continue to follow per POC until DC.     Follow Up Recommendations  Supervision for mobility/OOB;Supervision/Assistance - 24 hour;SNF     Equipment Recommendations  None recommended by PT       Precautions / Restrictions Precautions Precautions: Fall;Knee Precaution Booklet Issued: No Required Braces or Orthoses: Knee Immobilizer - Left Knee Immobilizer - Left: On at all times Restrictions Weight Bearing Restrictions: Yes LLE Weight Bearing: Non weight bearing    Mobility  Bed Mobility Overal bed mobility: Needs Assistance Bed Mobility: Supine to Sit;Sit to Supine     Supine to sit: Min assist;HOB elevated Sit to supine: Min assist   General bed mobility comments: Pt's father provided assistance for LLE mgmt    Transfers Overall transfer level: Needs assistance Equipment used: Rolling walker (2 wheeled) Transfers: Sit to/from Stand Sit to Stand: From elevated surface;Min guard         General transfer comment: pt was able to stand without  physical lifting assistance. vcs for imporved technique only. was able to adhere to proper NWB restrictions  Ambulation/Gait Ambulation/Gait assistance: Min guard;Supervision Gait Distance (Feet): 45 Feet Assistive device: Rolling walker (2 wheeled) Gait Pattern/deviations:  (" hop to")     General Gait Details: HR elkevated to 133 bpm at max during ambulation 45 ft with RW. NO LOB or unsteadiness. distance limited by fatigue/pain       Balance Overall balance assessment: Needs assistance Sitting-balance support: Bilateral upper extremity supported;Feet supported Sitting balance-Leahy Scale: Good     Standing balance support: Bilateral upper extremity supported;During functional activity Standing balance-Leahy Scale: Fair Standing balance comment: Pt highly reliant on the walker, quick to fatigue with any standing and better able maintain NWBing on L       Cognition Arousal/Alertness: Awake/alert Behavior During Therapy: WFL for tasks assessed/performed Overall Cognitive Status: Within Functional Limits for tasks assessed        General Comments: education given to pt's father as pt does not engage once returned to bed      Exercises General Exercises - Lower Extremity Ankle Circles/Pumps: AROM;10 reps Quad Sets: Strengthening;15 reps Gluteal Sets: AROM;10 reps Hip ABduction/ADduction: AAROM;10 reps;Strengthening Straight Leg Raises: AAROM;10 reps Other Exercises Other Exercises: Pt and father instructed in polar care mgt, falls prevention strategies, and home/routines modifications. Other Exercises: Toileting, sit>sup, sit<>stand, sitting/standing balance/tolerance        Pertinent Vitals/Pain Pain Assessment: 0-10 Pain Score: 4  Faces Pain Scale: Hurts even more Pain Location: LLE Pain Descriptors / Indicators: Discomfort;Grimacing Pain Intervention(s): Limited activity within patient's tolerance  PT Goals (current goals can now be found in the  care plan section) Acute Rehab PT Goals Patient Stated Goal: go home when ready Progress towards PT goals: Progressing toward goals    Frequency    BID      PT Plan Current plan remains appropriate    Co-evaluation     PT goals addressed during session: Mobility/safety with mobility;Balance;Proper use of DME;Strengthening/ROM        AM-PAC PT "6 Clicks" Mobility   Outcome Measure  Help needed turning from your back to your side while in a flat bed without using bedrails?: A Little Help needed moving from lying on your back to sitting on the side of a flat bed without using bedrails?: A Little Help needed moving to and from a bed to a chair (including a wheelchair)?: A Little Help needed standing up from a chair using your arms (e.g., wheelchair or bedside chair)?: A Lot Help needed to walk in hospital room?: A Lot Help needed climbing 3-5 steps with a railing? : A Lot 6 Click Score: 15    End of Session Equipment Utilized During Treatment: Gait belt;Left knee immobilizer Activity Tolerance: Patient limited by pain Patient left: in bed;with call bell/phone within reach;with bed alarm set;with family/visitor present Nurse Communication: Mobility status;Patient requests pain meds PT Visit Diagnosis: Unsteadiness on feet (R26.81);Other abnormalities of gait and mobility (R26.89);Difficulty in walking, not elsewhere classified (R26.2)     Time: 1405-1430 PT Time Calculation (min) (ACUTE ONLY): 25 min  Charges:  $Gait Training: 8-22 mins $Therapeutic Exercise: 8-22 mins                     Julaine Fusi PTA 02/22/21, 3:22 PM

## 2021-02-22 NOTE — Progress Notes (Signed)
Physical Therapy Treatment Patient Details Name: Todd Ward MRN: [id number] DOB: 03-18-2001 Today's Date: 02/22/2021    History of Present Illness Tarez Bowns is a [place] Male who presents after elective Left knee arthroscopy and chondroplasty with loose body removal, MPFL reconstruction using allograft, and tibial tubercle osteotomy. 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    PT Comments    Pt was long sitting in bed upon arriving. He is A and O x 4. Endorses minimal pain at rest however quickly elevated to 10/10 pain with movements/mobility. He was pre-medicated prior to session.  Pt only able to tolerate standing 3 x EOB. Did not progress away from EOB per pt request/pain limitations. He continues to move well. Pt's father was present throughout session and will be assisting pt at DC. Planning to DC home this afternoon via EMS transport. Pt has all needed equipment. Lengthy education on positioning and what to expect at home. Will return shortly for 2nd session with hopes pt's pain is better controlled.  MD messaged post session.   Follow Up Recommendations  Supervision for mobility/OOB;SNF;Other (comment) (pt planning to DC home via EMS this afternoon with 24/7 assistance from parents)     Equipment Recommendations  Other (comment);None recommended by PT (Pt has recieved RW/BSC/ WC with correct elevating leg rest)       Precautions / Restrictions Precautions Precautions: Fall;Knee Precaution Booklet Issued: No Required Braces or Orthoses: Knee Immobilizer - Left (ROM need brace locked in ext) Knee Immobilizer - Left: On at all times Restrictions Weight Bearing Restrictions: Yes LLE Weight Bearing: Non weight bearing    Mobility  Bed Mobility Overal bed mobility: Needs Assistance Bed Mobility: Supine to Sit     Supine to sit: Min assist;HOB elevated Sit to supine: Min assist;HOB elevated   General bed mobility comments: pt required assistance to exit and  return to bed. Pt's father present and will be assisting pt at home.    Transfers Overall transfer level: Needs assistance Equipment used: Rolling walker (2 wheeled) Transfers: Sit to/from Stand Sit to Stand: Min guard;From elevated surface         General transfer comment: CGA for safety from elevated bed height. stood 3 but was unable to progress away from EOB due to pain.  Ambulation/Gait      General Gait Details: pt unwilling 2/2 to pain      Balance Overall balance assessment: Needs assistance Sitting-balance support: Bilateral upper extremity supported;Feet supported Sitting balance-Leahy Scale: Good     Standing balance support: Bilateral upper extremity supported;During functional activity Standing balance-Leahy Scale: Fair Standing balance comment: Pt highly reliant on the walker, quick to fatigue with any standing and better able maintain NWBing on L      Cognition Arousal/Alertness: Awake/alert Behavior During Therapy: WFL for tasks assessed/performed Overall Cognitive Status: Within Functional Limits for tasks assessed        General Comments: pt is A and O x 4. session greatly limited by pain         General Comments General comments (skin integrity, edema, etc.): reviewed importance of ther ex, proper positioning, and polar care use at DC to promote strengthening and healing      Pertinent Vitals/Pain Pain Assessment: 0-10 Pain Score: 10-Worst pain ever (with movement) Pain Location: LLE Pain Descriptors / Indicators: Discomfort Pain Intervention(s): Premedicated before session;Repositioned;Monitored during session;Limited activity within patient's tolerance;Ice applied           PT Goals (current goals can now be found  in the care plan section) Acute Rehab PT Goals Patient Stated Goal: go home when ready Progress towards PT goals: Progressing toward goals    Frequency    BID      PT Plan Current plan remains appropriate     Co-evaluation     PT goals addressed during session: Mobility/safety with mobility;Balance;Proper use of DME        AM-PAC PT "6 Clicks" Mobility   Outcome Measure  Help needed turning from your back to your side while in a flat bed without using bedrails?: A Little Help needed moving from lying on your back to sitting on the side of a flat bed without using bedrails?: A Little Help needed moving to and from a bed to a chair (including a wheelchair)?: A Little Help needed standing up from a chair using your arms (e.g., wheelchair or bedside chair)?: A Lot Help needed to walk in hospital room?: A Lot Help needed climbing 3-5 steps with a railing? : A Lot 6 Click Score: 15    End of Session Equipment Utilized During Treatment: Gait belt;Left knee immobilizer Activity Tolerance: Patient limited by pain Patient left: in bed;with call bell/phone within reach;with bed alarm set;with family/visitor present Nurse Communication: Mobility status;Patient requests pain meds PT Visit Diagnosis: Unsteadiness on feet (R26.81);Other abnormalities of gait and mobility (R26.89);Difficulty in walking, not elsewhere classified (R26.2)     Time: 7829-5621 PT Time Calculation (min) (ACUTE ONLY): 27 min  Charges:  $Therapeutic Activity: 23-37 mins                     Jetta Lout PTA 02/22/21, 10:43 AM

## 2021-02-22 NOTE — TOC Progression Note (Signed)
Transition of Care [facility]) - Progression Note    Patient Details  Name: [name] MRN: [id number] Date of Birth: 2001/05/08  Transition of Care [facility]) CM/SW Contact  Barrie Dunker, RN Phone Number: 02/22/2021, 1:24 PM  Clinical Narrative:    [facility] to arrange transport home to [address] [place], spoke with Ivin Booty, notified him of the patient being 500+ Lbs and having 5 steps to go up to get into home, They will come to transport home at 5 PM, Notified the physician and the nurse  Expected Discharge Plan: Home w Home Health Services Barriers to Discharge: Continued Medical Work up  Expected Discharge Plan and Services Expected Discharge Plan: Home w Home Health Services   Discharge Planning Services: CM Consult   Living arrangements for the past 2 months: Single Family Home Expected Discharge Date: 02/22/21               DME Arranged: 3-N-1 DME Agency: [employer] Date DME Agency Contacted: 02/20/21 Time DME Agency Contacted: [phone] Representative spoke with at DME Agency: Juliette Alcide Arranged: PT HH Agency: [facility] Date HH Agency Contacted: 02/20/21 Time HH Agency Contacted: [phone] Representative spoke with at [facility] Agency: [place]   Social Determinants of Health (SDOH) Interventions    Readmission Risk Interventions No flowsheet data found.

## 2021-02-22 NOTE — Progress Notes (Signed)
  Subjective: 3 Days Post-Op Procedure(s) (LRB): Left knee arthroscopy and chondroplasty with loose body removal, MPFL reconstruction using allograft, and tibial tubercle osteotomy - RNFA needed (Left) Patient reports pain as mild.   Patient is well, and has had no acute complaints or problems Plan is to go Home after hospital stay. Negative for chest pain and shortness of breath Fever: no Gastrointestinal: Give for nausea and vomiting  Objective: Vital signs in last 24 hours: Temp:  [98.1 F (36.7 C)-98.7 F (37.1 C)] 98.7 F (37.1 C) (05/05 0356) Pulse Rate:  [86-99] 94 (05/05 0356) Resp:  [16-17] 17 (05/05 0356) BP: (121-141)/(58-79) 122/71 (05/05 0356) SpO2:  [96 %-100 %] 96 % (05/05 0356)  Intake/Output from previous day:  Intake/Output Summary (Last 24 hours) at 02/22/2021 0645 Last data filed at 02/22/2021 0029 Gross per 24 hour  Intake --  Output 1200 ml  Net -1200 ml    Intake/Output this shift: Total I/O In: -  Out: 600 [Urine:600]  Labs: Recent Labs    02/20/21 0527  HGB 12.1*   Recent Labs    02/20/21 0527  WBC 12.2*  RBC 4.40  HCT 36.0*  PLT 251   No results for input(s): NA, K, CL, CO2, BUN, CREATININE, GLUCOSE, CALCIUM in the last 72 hours. No results for input(s): LABPT, INR in the last 72 hours.   EXAM General - Patient is Alert and Oriented Extremity - Neurovascular intact Sensation intact distally Compartment soft Dressing/Incision - clean, dry, and no drainage Motor Function - intact, moving foot and toes well on exam.  Ambulated 45 feet including stairs with physical therapy.  Past Medical History:  Diagnosis Date  . Diabetes mellitus without complication (HCC)    No meds-Last A1C in 2020 was 5.4  . Elevated liver enzymes   . Family history of adverse reaction to anesthesia    PTS TWIN BROTHER-HARD TIME TO WAKE UP  . Hyperlipidemia   . Hypertension    off meds x 3-4 years  . Morbid obesity (HCC)   . Normocytic normochromic  anemia   . Obesity     Assessment/Plan: 3 Days Post-Op Procedure(s) (LRB): Left knee arthroscopy and chondroplasty with loose body removal, MPFL reconstruction using allograft, and tibial tubercle osteotomy - RNFA needed (Left) Active Problems:   Patellar dislocation  Estimated body mass index is 59.5 kg/m as calculated from the following:   Height as of this encounter: 6\' 3"  (1.905 m).   Weight as of this encounter: 215.9 kg. Advance diet Continue physical therapy Discharge planning to go home with home health physical therapy. Follow-up with [facility] clinic orthopedics in 2 weeks with Dr. [facility].  DVT Prophylaxis - Aspirin None Weight-Bearing  to left leg  Allena Katz, PA-C Orthopaedic Surgery 02/22/2021, 6:45 AM

## 2021-02-22 NOTE — TOC Progression Note (Signed)
Transition of Care [facility]) - Progression Note    Patient Details  Name: Todd Ward MRN: [id number] Date of Birth: 20-Nov-2000  Transition of Care [facility]) CM/SW Contact  Barrie Dunker, RN Phone Number: 02/22/2021, 10:44 AM  Clinical Narrative:   [facility] office and inquired if they would be able to transport the patient home with his weight being 500lbs and having 5 steps, Spoke with Jonny Ruiz, He stated that they could, just to let dispatch know when calling to set up    Expected Discharge Plan: Home w Home Health Services Barriers to Discharge: Continued Medical Work up  Expected Discharge Plan and Services Expected Discharge Plan: Home w Home Health Services   Discharge Planning Services: CM Consult   Living arrangements for the past 2 months: Single Family Home Expected Discharge Date: 02/22/21               DME Arranged: 3-N-1 DME Agency: [employer] Date DME Agency Contacted: 02/20/21 Time DME Agency Contacted: [phone] Representative spoke with at DME Agency: Juliette Alcide Arranged: PT HH Agency: [facility] Date HH Agency Contacted: 02/20/21 Time HH Agency Contacted: [phone] Representative spoke with at [facility] Agency: [place]   Social Determinants of Health (SDOH) Interventions    Readmission Risk Interventions No flowsheet data found.

## 2021-03-01 ENCOUNTER — Encounter: Payer: Self-pay | Admitting: Emergency Medicine

## 2021-03-01 ENCOUNTER — Emergency Department: Payer: PRIVATE HEALTH INSURANCE

## 2021-03-01 ENCOUNTER — Other Ambulatory Visit: Payer: Self-pay

## 2021-03-01 ENCOUNTER — Inpatient Hospital Stay
Admission: EM | Admit: 2021-03-01 | Discharge: 2021-03-05 | DRG: 871 | Disposition: A | Discharge status: Home / Self Care | Payer: PRIVATE HEALTH INSURANCE | Attending: Hospitalist | Admitting: Hospitalist

## 2021-03-01 DIAGNOSIS — J189 Pneumonia, unspecified organism: Secondary | ICD-10-CM | POA: Diagnosis not present

## 2021-03-01 DIAGNOSIS — Z9889 Other specified postprocedural states: Secondary | ICD-10-CM

## 2021-03-01 DIAGNOSIS — J180 Bronchopneumonia, unspecified organism: Secondary | ICD-10-CM | POA: Diagnosis present

## 2021-03-01 DIAGNOSIS — K7581 Nonalcoholic steatohepatitis (NASH): Secondary | ICD-10-CM | POA: Diagnosis present

## 2021-03-01 DIAGNOSIS — I82442 Acute embolism and thrombosis of left tibial vein: Secondary | ICD-10-CM | POA: Diagnosis present

## 2021-03-01 DIAGNOSIS — R748 Abnormal levels of other serum enzymes: Secondary | ICD-10-CM | POA: Diagnosis present

## 2021-03-01 DIAGNOSIS — R079 Chest pain, unspecified: Secondary | ICD-10-CM | POA: Diagnosis present

## 2021-03-01 DIAGNOSIS — Z7982 Long term (current) use of aspirin: Secondary | ICD-10-CM | POA: Diagnosis not present

## 2021-03-01 DIAGNOSIS — Z20822 Contact with and (suspected) exposure to covid-19: Secondary | ICD-10-CM | POA: Diagnosis present

## 2021-03-01 DIAGNOSIS — E785 Hyperlipidemia, unspecified: Secondary | ICD-10-CM | POA: Diagnosis present

## 2021-03-01 DIAGNOSIS — E119 Type 2 diabetes mellitus without complications: Secondary | ICD-10-CM | POA: Diagnosis present

## 2021-03-01 DIAGNOSIS — Z8616 Personal history of COVID-19: Secondary | ICD-10-CM

## 2021-03-01 DIAGNOSIS — G4733 Obstructive sleep apnea (adult) (pediatric): Secondary | ICD-10-CM | POA: Diagnosis present

## 2021-03-01 DIAGNOSIS — Z8249 Family history of ischemic heart disease and other diseases of the circulatory system: Secondary | ICD-10-CM

## 2021-03-01 DIAGNOSIS — I82409 Acute embolism and thrombosis of unspecified deep veins of unspecified lower extremity: Secondary | ICD-10-CM | POA: Diagnosis present

## 2021-03-01 DIAGNOSIS — I10 Essential (primary) hypertension: Secondary | ICD-10-CM | POA: Diagnosis present

## 2021-03-01 DIAGNOSIS — Z6841 Body Mass Index (BMI) 40.0 and over, adult: Secondary | ICD-10-CM

## 2021-03-01 DIAGNOSIS — Z79899 Other long term (current) drug therapy: Secondary | ICD-10-CM

## 2021-03-01 DIAGNOSIS — R0782 Intercostal pain: Secondary | ICD-10-CM

## 2021-03-01 DIAGNOSIS — I82462 Acute embolism and thrombosis of left calf muscular vein: Secondary | ICD-10-CM

## 2021-03-01 DIAGNOSIS — A419 Sepsis, unspecified organism: Principal | ICD-10-CM | POA: Diagnosis present

## 2021-03-01 LAB — HEPATIC FUNCTION PANEL
ALT: 76 U/L — ABNORMAL HIGH (ref 0–44)
AST: 50 U/L — ABNORMAL HIGH (ref 15–41)
Albumin: 3.6 g/dL (ref 3.5–5.0)
Alkaline Phosphatase: 124 U/L (ref 38–126)
Bilirubin, Direct: 0.3 mg/dL — ABNORMAL HIGH (ref 0.0–0.2)
Indirect Bilirubin: 1.1 mg/dL — ABNORMAL HIGH (ref 0.3–0.9)
Total Bilirubin: 1.4 mg/dL — ABNORMAL HIGH (ref 0.3–1.2)
Total Protein: 7.9 g/dL (ref 6.5–8.1)

## 2021-03-01 LAB — CBC
HCT: 35.6 % — ABNORMAL LOW (ref 39.0–52.0)
Hemoglobin: 11.5 g/dL — ABNORMAL LOW (ref 13.0–17.0)
MCH: 26.7 pg (ref 26.0–34.0)
MCHC: 32.3 g/dL (ref 30.0–36.0)
MCV: 82.6 fL (ref 80.0–100.0)
Platelets: 376 10*3/uL (ref 150–400)
RBC: 4.31 MIL/uL (ref 4.22–5.81)
RDW: 13.2 % (ref 11.5–15.5)
WBC: 13.8 10*3/uL — ABNORMAL HIGH (ref 4.0–10.5)
nRBC: 0 % (ref 0.0–0.2)

## 2021-03-01 LAB — BASIC METABOLIC PANEL
Anion gap: 11 (ref 5–15)
BUN: 14 mg/dL (ref 6–20)
CO2: 23 mmol/L (ref 22–32)
Calcium: 9.6 mg/dL (ref 8.9–10.3)
Chloride: 101 mmol/L (ref 98–111)
Creatinine, Ser: 0.78 mg/dL (ref 0.61–1.24)
GFR, Estimated: >60 mL/min (ref >60)
Glucose, Bld: 102 mg/dL — ABNORMAL HIGH (ref 70–99)
Potassium: 4.1 mmol/L (ref 3.5–5.1)
Sodium: 135 mmol/L (ref 135–145)

## 2021-03-01 LAB — BRAIN NATRIURETIC PEPTIDE: B Natriuretic Peptide: 13.5 pg/mL (ref 0.0–100.0)

## 2021-03-01 LAB — HIV ANTIBODY (ROUTINE TESTING W REFLEX): HIV Screen 4th Generation wRfx: NONREACTIVE

## 2021-03-01 LAB — GLUCOSE, CAPILLARY
Glucose-Capillary: 92 mg/dL (ref 70–99)
Glucose-Capillary: 82 mg/dL (ref 70–99)

## 2021-03-01 LAB — HEPARIN LEVEL (UNFRACTIONATED): Heparin Unfractionated: 0.12 IU/mL — ABNORMAL LOW (ref 0.30–0.70)

## 2021-03-01 LAB — RESP PANEL BY RT-PCR (FLU A&B, COVID) ARPGX2
Influenza A by PCR: NEGATIVE
Influenza B by PCR: NEGATIVE
SARS Coronavirus 2 by RT PCR: NEGATIVE

## 2021-03-01 LAB — TROPONIN I (HIGH SENSITIVITY)
Troponin I (High Sensitivity): 3 ng/L (ref <18)
Troponin I (High Sensitivity): <2 ng/L (ref <18)
Troponin I (High Sensitivity): 3 ng/L (ref <18)

## 2021-03-01 LAB — LACTIC ACID, PLASMA: Lactic Acid, Venous: 1.4 mmol/L (ref 0.5–1.9)

## 2021-03-01 MED ORDER — OXYCODONE HCL 5 MG PO TABS
5.0000 mg | ORAL_TABLET | ORAL | Status: DC | PRN
  Administered 2021-03-01 – 2021-03-02 (×2): 10 mg via ORAL
  Filled 2021-03-01 (×3): qty 2

## 2021-03-01 MED ORDER — MORPHINE SULFATE (PF) 4 MG/ML IV SOLN
4.0000 mg | INTRAVENOUS | Status: DC | PRN
  Administered 2021-03-01 – 2021-03-02 (×4): 4 mg via INTRAVENOUS
  Filled 2021-03-01 (×4): qty 1

## 2021-03-01 MED ORDER — ONDANSETRON HCL 4 MG/2ML IJ SOLN
4.0000 mg | Freq: Once | INTRAMUSCULAR | Status: AC
  Administered 2021-03-01: 4 mg via INTRAVENOUS
  Filled 2021-03-01: qty 2

## 2021-03-01 MED ORDER — LACTATED RINGERS IV SOLN: INTRAVENOUS | Status: DC

## 2021-03-01 MED ORDER — ALBUTEROL SULFATE HFA 108 (90 BASE) MCG/ACT IN AERS
2.0000 | INHALATION_SPRAY | Freq: Four times a day (QID) | RESPIRATORY_TRACT | Status: DC | PRN
  Administered 2021-03-03 – 2021-03-04 (×2): 2 via RESPIRATORY_TRACT
  Filled 2021-03-01 (×2): qty 6.7

## 2021-03-01 MED ORDER — CEFTRIAXONE SODIUM 2 G IJ SOLR
2.0000 g | INTRAMUSCULAR | Status: DC
  Administered 2021-03-01 – 2021-03-04 (×4): 2 g via INTRAVENOUS
  Filled 2021-03-01 (×3): qty 20
  Filled 2021-03-01: qty 2
  Filled 2021-03-01 (×2): qty 20

## 2021-03-01 MED ORDER — METHOCARBAMOL 500 MG PO TABS
500.0000 mg | ORAL_TABLET | Freq: Four times a day (QID) | ORAL | Status: DC | PRN
  Administered 2021-03-02 – 2021-03-04 (×6): 500 mg via ORAL
  Filled 2021-03-01 (×8): qty 1

## 2021-03-01 MED ORDER — ENOXAPARIN SODIUM 120 MG/0.8ML IJ SOSY
0.5000 mg/kg | PREFILLED_SYRINGE | INTRAMUSCULAR | Status: DC
  Filled 2021-03-01: qty 0.7

## 2021-03-01 MED ORDER — SENNA 8.6 MG PO TABS
1.0000 | ORAL_TABLET | Freq: Every day | ORAL | Status: DC
  Administered 2021-03-03 – 2021-03-04 (×2): 8.6 mg via ORAL
  Filled 2021-03-01 (×2): qty 1

## 2021-03-01 MED ORDER — SODIUM CHLORIDE 0.9 % IV SOLN
500.0000 mg | INTRAVENOUS | Status: DC
  Administered 2021-03-01 – 2021-03-04 (×4): 500 mg via INTRAVENOUS
  Filled 2021-03-01 (×5): qty 500

## 2021-03-01 MED ORDER — ASPIRIN EC 325 MG PO TBEC: 325.0000 mg | DELAYED_RELEASE_TABLET | Freq: Every day | ORAL | Status: DC

## 2021-03-01 MED ORDER — SODIUM CHLORIDE 0.9 % IV BOLUS: 500.0000 mL | Freq: Once | INTRAVENOUS | Status: DC

## 2021-03-01 MED ORDER — IOHEXOL 350 MG/ML SOLN
100.0000 mL | Freq: Once | INTRAVENOUS | Status: AC | PRN
  Administered 2021-03-01: 100 mL via INTRAVENOUS

## 2021-03-01 MED ORDER — SODIUM CHLORIDE 0.9 % IV SOLN: INTRAVENOUS | Status: DC

## 2021-03-01 MED ORDER — CEFEPIME HCL 2 G IJ SOLR
2.0000 g | Freq: Once | INTRAMUSCULAR | Status: AC
Start: 2021-03-01 — End: 2021-03-01
  Administered 2021-03-01: 2 g via INTRAVENOUS
  Filled 2021-03-01: qty 2

## 2021-03-01 MED ORDER — HEPARIN BOLUS VIA INFUSION
4100.0000 [IU] | Freq: Once | INTRAVENOUS | Status: AC
  Administered 2021-03-01: 4100 [IU] via INTRAVENOUS
  Filled 2021-03-01: qty 4100

## 2021-03-01 MED ORDER — HEPARIN BOLUS VIA INFUSION
6000.0000 [IU] | Freq: Once | INTRAVENOUS | Status: AC
  Administered 2021-03-01: 6000 [IU] via INTRAVENOUS
  Filled 2021-03-01: qty 6000

## 2021-03-01 MED ORDER — ALBUTEROL SULFATE (2.5 MG/3ML) 0.083% IN NEBU
2.5000 mg | INHALATION_SOLUTION | Freq: Once | RESPIRATORY_TRACT | Status: AC
  Administered 2021-03-01: 2.5 mg via RESPIRATORY_TRACT
  Filled 2021-03-01: qty 3

## 2021-03-01 MED ORDER — VANCOMYCIN HCL IN DEXTROSE 1-5 GM/200ML-% IV SOLN
1000.0000 mg | Freq: Once | INTRAVENOUS | Status: AC
  Administered 2021-03-01: 1000 mg via INTRAVENOUS
  Filled 2021-03-01: qty 200

## 2021-03-01 MED ORDER — HEPARIN (PORCINE) 25000 UT/250ML-% IV SOLN
2750.0000 [IU]/h | INTRAVENOUS | Status: DC
  Administered 2021-03-01: 2000 [IU]/h via INTRAVENOUS
  Filled 2021-03-01 (×3): qty 250

## 2021-03-01 NOTE — ED Notes (Signed)
Phlebotomy notified for blood draw.

## 2021-03-01 NOTE — ED Notes (Signed)
IV insertion attempted x 2 , one successful but unable to draw blood. 2nd nurse called to attempt blood draw.

## 2021-03-01 NOTE — ED Triage Notes (Signed)
Pt comes into the ED via [facility] from home c/o right intercostal pain.  Pt underwent recent ligament repair on the left knee and then started having the intercostal pain last night.  Pt states the pain is worse when he takes a deep breath and he has increased SHOB.  Pt presented for EMS with sinus tach and all other VSS.

## 2021-03-01 NOTE — ED Notes (Signed)
Patient transported to CT 

## 2021-03-01 NOTE — Consult Note (Signed)
ANTICOAGULATION CONSULT NOTE   Pharmacy Consult for heparin Indication: DVT  No Known Allergies  Patient Measurements: Height: 6\' 3"  (190.5 cm) Weight: (!) 207.5 kg (457 lb 8 oz) IBW/kg (Calculated) : 84.5 Heparin Dosing Weight: 138.7 kg  Vital Signs: Temp: 98.6 F (37 C) (05/12 1412) Temp Source: Oral (05/12 1412) BP: 158/102 (05/12 1412) Pulse Rate: 99 (05/12 1412)  Labs: Recent Labs    03/01/21 0933 03/01/21 1339  HGB 11.5*  --   HCT 35.6*  --   PLT 376  --   CREATININE 0.78  --   TROPONINIHS 3 <2    Estimated Creatinine Clearance: 278.5 mL/min (by C-G formula based on SCr of 0.78 mg/dL).   Medical History: Past Medical History:  Diagnosis Date  . Diabetes mellitus without complication (HCC)    No meds-Last A1C in 2020 was 5.4  . Elevated liver enzymes   . Family history of adverse reaction to anesthesia    PTS TWIN BROTHER-HARD TIME TO WAKE UP  . Hyperlipidemia   . Hypertension    off meds x 3-4 years  . Morbid obesity (HCC)   . Normocytic normochromic anemia   . Obesity     Medications:  Aspirin 325 mg daily for VTE ppx s/p orthopedic surgery No PTA AC  Assessment: 20 y.o. male with medical history significant for morbid obesity (BMI 57), hypertension, status post recent left knee arthroscopy and chondroplasty with loose body removal, MPFL reconstruction using allograft and tibial tubercle osteotomy who presents to the emergency room for evaluation of pleuritic chest pain. LLE 26 significant for DVT. Pharmacy has been consulted for heparin dosing. Hgb: 11.5 and plts: 376  Goal of Therapy:  Heparin level 0.3-0.7 units/ml Monitor platelets by anticoagulation protocol: Yes   Plan:  Give 6000 units bolus x 1 Start heparin infusion at 2000 units/hr Check anti-Xa level in 6 hours and daily while on heparin Continue to monitor H&H and platelets   [place], PharmD 03/01/2021,2:36 PM

## 2021-03-01 NOTE — Consult Note (Signed)
ANTICOAGULATION CONSULT NOTE   Pharmacy Consult for heparin Indication: DVT  No Known Allergies  Patient Measurements: Height: 6\' 3"  (190.5 cm) Weight: (!) 207.5 kg (457 lb 8 oz) IBW/kg (Calculated) : 84.5 Heparin Dosing Weight: 138.7 kg  Vital Signs: Temp: 99 F (37.2 C) (05/12 1941) Temp Source: Oral (05/12 1741) BP: 115/54 (05/12 1941) Pulse Rate: 115 (05/12 1943)  Labs: Recent Labs    03/01/21 0933 03/01/21 1339 03/01/21 2146  HGB 11.5*  --   --   HCT 35.6*  --   --   PLT 376  --   --   HEPARINUNFRC  --   --  0.12*  CREATININE 0.78  --   --   TROPONINIHS 3 <2 3    Estimated Creatinine Clearance: 278.5 mL/min (by C-G formula based on SCr of 0.78 mg/dL).   Medical History: Past Medical History:  Diagnosis Date  . Diabetes mellitus without complication (HCC)    No meds-Last A1C in 2020 was 5.4  . Elevated liver enzymes   . Family history of adverse reaction to anesthesia    PTS TWIN BROTHER-HARD TIME TO WAKE UP  . Hyperlipidemia   . Hypertension    off meds x 3-4 years  . Morbid obesity (HCC)   . Normocytic normochromic anemia   . Obesity     Medications:  Aspirin 325 mg daily for VTE ppx s/p orthopedic surgery No PTA AC  Assessment: 20 y.o. male with medical history significant for morbid obesity (BMI 57), hypertension, status post recent left knee arthroscopy and chondroplasty with loose body removal, MPFL reconstruction using allograft and tibial tubercle osteotomy who presents to the emergency room for evaluation of pleuritic chest pain. LLE 26 significant for DVT. Pharmacy has been consulted for heparin dosing. Hgb: 11.5 and plts: 376  Goal of Therapy:  Heparin level 0.3-0.7 units/ml Monitor platelets by anticoagulation protocol: Yes   Plan:  5/12:  HL @ 2146 = 0.12 Will order Heparin 4100 units IV X 1 bolus and increase drip rate 2500 units/hr.  Will recheck HL 6 hrs after rate change.   Ketsia Linebaugh D, PharmD 03/01/2021,11:03 PM

## 2021-03-01 NOTE — ED Notes (Signed)
Zach RN aware of assigned bed 

## 2021-03-01 NOTE — H&P (Addendum)
History and Physical    Todd Ward SWN:[id number] DOB: Jul 22, 2001 DOA: 03/01/2021  PCP: [facility]   Patient coming from: Home  I have personally briefly reviewed patient's old medical records in [facility]  Chief Complaint: Chest pain  HPI: Todd Ward is a 20 y.o. male with medical history significant for morbid obesity (BMI 57), diet-controlled diabetes mellitus, transaminitis, COVID-19 viral infection (04/22 treated with Paxlovid for 5 days), hypertension, status post recent left knee arthroscopy and chondroplasty with loose body removal, MPFL reconstruction using allograft and tibial tubercle osteotomy who presents to the emergency room for evaluation of pain mostly in the left intercostal area.  Pain is pleuritic and worse with inspiration associated with shortness of breath.  He denies having any fever or chills and denies having any cough.  He has no lower extremity swelling. He denies having any abdominal pain, no changes in his bowel habits, no nausea, no vomiting, no diaphoresis, no palpitations, no urinary symptoms, no headache, no blurred vision, no dizziness, no lightheadedness. Labs show sodium 135, potassium 4.1, chloride 108, bicarb 23, glucose 92, BUN 14, creatinine 0.78, calcium 9.6, alkaline phosphatase 124, albumin 3.6, AST 15, ALT total protein 7.9, indirect bilirubin 1.9, troponin 3, lactic acid 1.4, white count 13.8, hemoglobin 11.5, hematocrit 35.6, MCV 82.6, RDW 13.2, platelet count 376. Respiratory viral panel is negative CT angiogram, PE protocol shows no evidence of acute pulmonary embolus to the proximal segmental level. Marked respiratory motion artifact limits evaluation of the more peripheral pulmonary arterial tree. Focal patchy airspace opacity in the medial basal segment of the left lower lobe concerning for bronchopneumonia. Dependent atelectasis bilaterally. Left lower extremity venous Doppler is positive for occlusive deep venous  thrombosis involving at least 1 of the paired posterior tibial veins in the calf. Suspect additional calf vein DVT as well. No evidence of DVT extension above the calf. Chest x-ray reviewed by me shows mild bibasilar strandy opacities likely a combination of atelectasis and pulmonary edema. Twelve-lead EKG reviewed by me shows sinus tachycardia with low voltage QRS..   ED Course: Patient is a 20 year old morbidly obese Caucasian male who is status post recent orthopedic surgery who presents to the ER for evaluation of chest pain mostly in the left intercostal area pain.  Pain is pleuritic in nature and associated with shortness of breath.  Patient had a CT angiogram which was negative for PE but shows bronchopneumonia involving the left lobe.  Left lower extremity ultrasound shows a DVT posterior tibial veins in the calf.  Patient received IV antibiotic therapy and will be admitted to the hospital for further evaluation.    Review of Systems: As per HPI otherwise all other systems reviewed and negative.    Past Medical History:  Diagnosis Date  . Diabetes mellitus without complication (HCC)    No meds-Last A1C in 2020 was 5.4  . Elevated liver enzymes   . Family history of adverse reaction to anesthesia    PTS TWIN BROTHER-HARD TIME TO WAKE UP  . Hyperlipidemia   . Hypertension    off meds x 3-4 years  . Morbid obesity (HCC)   . Normocytic normochromic anemia   . Obesity     Past Surgical History:  Procedure Laterality Date  . KNEE ARTHROSCOPY Left 02/16/2016   Procedure: ARTHROSCOPY KNEE, LATERAL RELEASE, LOOSE BODY REMOVAL;  Surgeon: Kennedy Bucker, MD;  Location: [facility] ORS;  Service: Orthopedics;  Laterality: Left;  . KNEE ARTHROSCOPY WITH MEDIAL PATELLAR FEMORAL LIGAMENT RECONSTRUCTION  Left 02/19/2021   Procedure: Left knee arthroscopy and chondroplasty with loose body removal, MPFL reconstruction using allograft, and tibial tubercle osteotomy - RNFA needed;  Surgeon: Signa Kell,  MD;  Location: [facility] ORS;  Service: Orthopedics;  Laterality: Left;  RNFA needed     reports that he has never smoked. He has never used smokeless tobacco. He reports that he does not drink alcohol and does not use drugs.  No Known Allergies  Family History  Problem Relation Age of Onset  . Hypertension Mother       Prior to Admission medications   Medication Sig Start Date End Date Taking? Authorizing Provider  albuterol (VENTOLIN HFA) 108 (90 Base) MCG/ACT inhaler Inhale 2 puffs into the lungs every 6 (six) hours as needed for wheezing or shortness of breath. 01/29/21  Yes Causey, Larna Daughters, NP  oxyCODONE (OXY IR/ROXICODONE) 5 MG immediate release tablet Take 1-2 tablets (5-10 mg total) by mouth every 4 (four) hours as needed for moderate pain (pain score 4-6). 02/20/21  Yes Dedra Skeens, PA-C  traMADol (ULTRAM) 50 MG tablet Take 1 tablet (50 mg total) by mouth every 6 (six) hours as needed for moderate pain. 02/20/21  Yes Dedra Skeens, PA-C  aspirin EC 325 MG EC tablet Take 1 tablet (325 mg total) by mouth daily. 02/20/21 03/22/21  Dedra Skeens, PA-C  methocarbamol (ROBAXIN) 500 MG tablet Take 1 tablet (500 mg total) by mouth every 6 (six) hours as needed for up to 20 doses for muscle spasms. 02/20/21   Dedra Skeens, PA-C  ondansetron (ZOFRAN) 4 MG tablet Take 1 tablet (4 mg total) by mouth every 6 (six) hours as needed for nausea. 02/20/21   Dedra Skeens, PA-C    Physical Exam: Vitals:   03/01/21 1100 03/01/21 1130 03/01/21 1200 03/01/21 1412  BP: 112/72 125/79 137/80 (!) 158/102  Pulse: 94 96 (!) 103 99  Resp: (!) 32 (!) 26 (!) 26 16  Temp:    98.6 F (37 C)  TempSrc:    Oral  SpO2: 97% 93% 92% 98%  Weight:      Height:         Vitals:   03/01/21 1100 03/01/21 1130 03/01/21 1200 03/01/21 1412  BP: 112/72 125/79 137/80 (!) 158/102  Pulse: 94 96 (!) 103 99  Resp: (!) 32 (!) 26 (!) 26 16  Temp:    98.6 F (37 C)  TempSrc:    Oral  SpO2: 97% 93% 92% 98%  Weight:      Height:           Constitutional: Alert and oriented x 3 . Not in any apparent distress HEENT:      Head: Normocephalic and atraumatic.         Eyes: PERLA, EOMI, Conjunctivae are normal. Sclera is non-icteric.       Mouth/Throat: Mucous membranes are moist.       Neck: Supple with no signs of meningismus. Cardiovascular: Regular rate and rhythm. No murmurs, gallops, or rubs. 2+ symmetrical distal pulses are present . No JVD. No LE edema Respiratory:  Tachypneic.rhonchi in the left lower lung fields. No wheezes, crackles Gastrointestinal: Soft, tender left upper quadrant, and non distended with positive bowel sounds.  Genitourinary: No CVA tenderness. Musculoskeletal:  Decreased range of motion left lower extremity with external fixator in place  Neurologic:  Face is symmetric. Moving all extremities. No gross focal neurologic deficits . Skin: Skin is warm, dry.  No rash or ulcers Psychiatric: Mood and affect  are normal   Labs on Admission: I have personally reviewed following labs and imaging studies  CBC: Recent Labs  Lab 03/01/21 0933  WBC 13.8*  HGB 11.5*  HCT 35.6*  MCV 82.6  PLT 376   Basic Metabolic Panel: Recent Labs  Lab 03/01/21 0933  NA 135  K 4.1  CL 101  CO2 23  GLUCOSE 102*  BUN 14  CREATININE 0.78  CALCIUM 9.6   GFR: Estimated Creatinine Clearance: 278.5 mL/min (by C-G formula based on SCr of 0.78 mg/dL). Liver Function Tests: Recent Labs  Lab 03/01/21 0933  AST 50*  ALT 76*  ALKPHOS 124  BILITOT 1.4*  PROT 7.9  ALBUMIN 3.6   No results for input(s): LIPASE, AMYLASE in the last 168 hours. No results for input(s): AMMONIA in the last 168 hours. Coagulation Profile: No results for input(s): INR, PROTIME in the last 168 hours. Cardiac Enzymes: No results for input(s): CKTOTAL, CKMB, CKMBINDEX, TROPONINI in the last 168 hours. BNP (last 3 results) No results for input(s): PROBNP in the last 8760 hours. HbA1C: No results for input(s): HGBA1C in the  last 72 hours. CBG: No results for input(s): GLUCAP in the last 168 hours. Lipid Profile: No results for input(s): CHOL, HDL, LDLCALC, TRIG, CHOLHDL, LDLDIRECT in the last 72 hours. Thyroid Function Tests: No results for input(s): TSH, T4TOTAL, FREET4, T3FREE, THYROIDAB in the last 72 hours. Anemia Panel: No results for input(s): VITAMINB12, FOLATE, FERRITIN, TIBC, IRON, RETICCTPCT in the last 72 hours. Urine analysis: No results found for: COLORURINE, APPEARANCEUR, LABSPEC, PHURINE, GLUCOSEU, HGBUR, BILIRUBINUR, KETONESUR, PROTEINUR, UROBILINOGEN, NITRITE, LEUKOCYTESUR  Radiological Exams on Admission: CT Angio Chest PE W and/or Wo Contrast  Result Date: 03/01/2021 CLINICAL DATA:  High probability for pulmonary embolism EXAM: CT ANGIOGRAPHY CHEST WITH CONTRAST TECHNIQUE: Multidetector CT imaging of the chest was performed using the standard protocol during bolus administration of intravenous contrast. Multiplanar CT image reconstructions and MIPs were obtained to evaluate the vascular anatomy. CONTRAST:  OMNIPAQUE IOHEXOL 350 MG/ML SOLN COMPARISON:  None. FINDINGS: Cardiovascular: Satisfactory opacification of the pulmonary arteries to the proximal segmental level. Advanced respiratory motion artifact limits evaluation of the more distal pulmonary arterial tree. No evidence of pulmonary embolism. Normal heart size. No pericardial effusion. Mediastinum/Nodes: No enlarged mediastinal, hilar, or axillary lymph nodes. Thyroid gland, trachea, and esophagus demonstrate no significant findings. Lungs/Pleura: Marked respiratory motion artifact. Dependent atelectasis present bilaterally. More focal airspace opacity present within the medial basilar segment of the left lower lobe. Findings are concerning for bronchopneumonia. Upper Abdomen: No acute abnormality. Musculoskeletal: No chest wall abnormality. No acute or significant osseous findings. Review of the MIP images confirms the above findings.  IMPRESSION: 1. No evidence of acute pulmonary embolus to the proximal segmental level. Marked respiratory motion artifact limits evaluation of the more peripheral pulmonary arterial tree. 2. Focal patchy airspace opacity in the medial basal segment of the left lower lobe concerning for bronchopneumonia. 3. Dependent atelectasis bilaterally. Electronically Signed   By: Malachy Moan M.D.   On: 03/01/2021 12:12   US Venous Img Lower Unilateral Left  Result Date: 03/01/2021 CLINICAL DATA:  20 year old male with left lower extremity pain EXAM: LEFT LOWER EXTREMITY VENOUS DOPPLER ULTRASOUND TECHNIQUE: Gray-scale sonography with graded compression, as well as color Doppler and duplex ultrasound were performed to evaluate the lower extremity deep venous systems from the level of the common femoral vein and including the common femoral, femoral, profunda femoral, popliteal and calf veins including the posterior tibial, peroneal and  gastrocnemius veins when visible. The superficial great saphenous vein was also interrogated. Spectral Doppler was utilized to evaluate flow at rest and with distal augmentation maneuvers in the common femoral, femoral and popliteal veins. COMPARISON:  None. FINDINGS: Contralateral Common Femoral Vein: Respiratory phasicity is normal and symmetric with the symptomatic side. No evidence of thrombus. Normal compressibility. Common Femoral Vein: No evidence of thrombus. Normal compressibility, respiratory phasicity and response to augmentation. Saphenofemoral Junction: No evidence of thrombus. Normal compressibility and flow on color Doppler imaging. Profunda Femoral Vein: No evidence of thrombus. Normal compressibility and flow on color Doppler imaging. Femoral Vein: No evidence of thrombus. Normal compressibility, respiratory phasicity and response to augmentation. Popliteal Vein: No evidence of thrombus. Normal compressibility, respiratory phasicity and response to augmentation. Calf  Veins: Absence of flow within at least 1 of the paired posterior tibial veins. The vessel lumen is expanded and filled with low-level internal echoes. No evidence of color flow on color Doppler. Additionally, there is poor visualization of the peroneal veins. No definite color flow on color Doppler imaging. Suspect additional calf DVT. Superficial Great Saphenous Vein: No evidence of thrombus. Normal compressibility. Venous Reflux:  None. Other Findings:  None. IMPRESSION: Positive for occlusive deep venous thrombosis involving at least 1 of the paired posterior tibial veins in the calf. Suspect additional calf vein DVT as well. No evidence of DVT extension above the calf. Electronically Signed   By: Malachy MoanHeath  McCullough M.D.   On: 03/01/2021 12:06   DG Chest Port 1 View  Result Date: 03/01/2021 CLINICAL DATA:  Intercostal pain Weakness and shortness of breath Recent left knee surgery EXAM: PORTABLE CHEST 1 VIEW COMPARISON:  None. FINDINGS: Evaluation limited due to expiratory phase of imaging. The heart appears mildly enlarged with mild pulmonary vascular congestion. Mild bibasilar strandy opacities likely a combination of atelectasis and pulmonary edema. IMPRESSION: Constellation of findings most consistent with CHF. Electronically Signed   By: Acquanetta BellingFarhaan  Mir M.D.   On: 03/01/2021 10:34     Assessment/Plan Principal Problem:   CAP (community acquired pneumonia) Active Problems:   DVT (deep venous thrombosis) (HCC)   Diabetes mellitus without complication (HCC)   Morbid obesity (HCC)   Elevated liver enzymes     Community-acquired pneumonia Patient presents for evaluation of pleuritic chest pain and shortness of breath CT angiogram was negative for PE but shows bronchopneumonia involving the left lower lobe We will treat patient empirically with Rocephin and Zithromax Follow-up results of blood cultures     Left leg DVT Patient is status post recent extensive orthopedic surgery involving his  left lower extremity Ultrasound shows a DVT in the posterior tibial vein We will start patient on heparin     Transaminitis Most likely related to steatohepatitis from morbid obesity We will monitor closely    Diabetes mellitus Diet controlled Maintain consistent carbohydrate diet Blood sugar checks AC meals    Morbid obesity (BMI 57) Complicates overall prognosis and care.    Status post recent Left knee arthroscopy and chondroplasty with loose body removal, MPFL reconstruction using allograft, and tibial tubercle osteotomy  Discussed with orthopedic surgeon who will see patient in the office during his scheduled follow-up appointment He recommends the patient remain nonweightbearing on that left leg Pain control  DVT prophylaxis: Heparin Code Status: full code Family Communication: Greater than 50% of time was spent discussing patient's condition and plan of care with him and his mother at the bedside.  All questions and concerns have been addressed.  They verbalized  understanding and agree with the plan. Disposition Plan: Back to previous home environment Consults called: None Status: At the time of admission, it appears that the appropriate admission status for this patient is inpatient. This is judged to be reasonable and necessary in order to provide the required intensity of service to ensure the patient's safety given the presenting symptoms, physical exam findings, and initial radiographic and laboratory data in the context of their comorbid conditions. Patient requires inpatient status due to high intensity of service, high risk for further deterioration and high frequency of surveillance required.    Lucile Shutters MD [facility]     03/01/2021, 2:26 PM

## 2021-03-01 NOTE — ED Notes (Signed)
[place] in room at present

## 2021-03-01 NOTE — ED Notes (Signed)
Lab called to help with blood draw

## 2021-03-01 NOTE — Progress Notes (Signed)
   03/01/21 1741  Assess: MEWS Score  Temp 99.1 F (37.3 C)  BP 131/79  Pulse Rate (!) 106  Resp (!) 29  Level of Consciousness Alert  SpO2 95 %  O2 Device Nasal Cannula  O2 Flow Rate (L/min) 2 L/min  Assess: MEWS Score  MEWS Temp 0  MEWS Systolic 0  MEWS Pulse 1  MEWS RR 2  MEWS LOC 0  MEWS Score 3  MEWS Score Color Yellow  Assess: if the MEWS score is Yellow or Red  Were vital signs taken at a resting state? Yes  Focused Assessment Change from prior assessment (see assessment flowsheet)  Early Detection of Sepsis Score *See Row Information* Medium  MEWS guidelines implemented *See Row Information* Yes  Take Vital Signs  Increase Vital Sign Frequency  Yellow: Q 2hr X 2 then Q 4hr X 2, if remains yellow, continue Q 4hrs  Escalate  MEWS: Escalate Yellow: discuss with charge nurse/RN and consider discussing with provider and RRT  Notify: Charge Nurse/RN  Name of Charge Nurse/RN Notified Wynn Banker, RN  Date Charge Nurse/RN Notified 03/01/21  Time Charge Nurse/RN Notified 1745  Notify: Provider  Provider Name/Title Dr. Joylene Igo  Date Provider Notified 03/01/21  Time Provider Notified 1821  Notification Type Page  Notification Reason Change in status;Other (Comment) (FYI due to Tachycardia and Tachypnea.)  Provider response No new orders;Other (Comment) (Will continue to assess as O2 therapy initiated)

## 2021-03-01 NOTE — Progress Notes (Signed)
Patient admitted from Oncology Unit room 130 to Orthopedic Unit in room 133.  Patient will begin anticoagulation treatment for DVT to LLE and antibiotic therapy for pneumonia.  A+Ox4. Patient afebrile.  Tachypneic and tachycardic.  Telemetry in place.  Will start O2 therapy.  Patient recovering from Knee Surgery and has [facility] in place.  Parents at bedside and very helpful.  Will continue to monitor and assess with plan of care.

## 2021-03-01 NOTE — Consult Note (Signed)
PHARMACY -  BRIEF ANTIBIOTIC NOTE   Pharmacy has received consult(s) for Vancomycin and Cefepime from an ED provider.  The patient's profile has been reviewed for ht/wt/allergies/indication/available labs.    One time order(s) placed for Vancomycin 1g IV and Cefepime 2g IV x 1 dose each.  Further antibiotics/pharmacy consults should be ordered by admitting physician if indicated.                       Thank you, Bettey Costa 03/01/2021  12:33 PM

## 2021-03-01 NOTE — ED Provider Notes (Signed)
[facility] Emergency Department Provider Note    Event Date/Time   First MD Initiated Contact with Patient 03/01/21 [phone]     (approximate)  I have reviewed the triage vital signs and the nursing notes.   HISTORY  Chief Complaint Chest Pain    HPI Todd Ward is a 20 y.o. male below listed past medical history status post recent orthopedic surgery requiring OR on the third also status post COVID roughly 5 weeks ago presenting to the ER for evaluation of chest pain pleuritic in nature started yesterday.  Does feel some mild dyspnea.  Not any blood thinners.  Takes daily aspirin.  No known sick contacts.  Speaking in short phrases due to significant pain.    Past Medical History:  Diagnosis Date  . Diabetes mellitus without complication (HCC)    No meds-Last A1C in 2020 was 5.4  . Elevated liver enzymes   . Family history of adverse reaction to anesthesia    PTS TWIN BROTHER-HARD TIME TO WAKE UP  . Hyperlipidemia   . Hypertension    off meds x 3-4 years  . Morbid obesity (HCC)   . Normocytic normochromic anemia   . Obesity    Family History  Problem Relation Age of Onset  . Hypertension Mother    Past Surgical History:  Procedure Laterality Date  . KNEE ARTHROSCOPY Left 02/16/2016   Procedure: ARTHROSCOPY KNEE, LATERAL RELEASE, LOOSE BODY REMOVAL;  Surgeon: Kennedy Bucker, MD;  Location: [facility] ORS;  Service: Orthopedics;  Laterality: Left;  . KNEE ARTHROSCOPY WITH MEDIAL PATELLAR FEMORAL LIGAMENT RECONSTRUCTION Left 02/19/2021   Procedure: Left knee arthroscopy and chondroplasty with loose body removal, MPFL reconstruction using allograft, and tibial tubercle osteotomy - RNFA needed;  Surgeon: Signa Kell, MD;  Location: [facility] ORS;  Service: Orthopedics;  Laterality: Left;  RNFA needed   Patient Active Problem List   Diagnosis Date Noted  . CAP (community acquired pneumonia) 03/01/2021  . DVT (deep venous thrombosis) (HCC) 03/01/2021  . Diabetes  mellitus without complication (HCC)   . Morbid obesity (HCC)   . Elevated liver enzymes   . Patellar dislocation 02/19/2021      Prior to Admission medications   Medication Sig Start Date End Date Taking? Authorizing Provider  albuterol (VENTOLIN HFA) 108 (90 Base) MCG/ACT inhaler Inhale 2 puffs into the lungs every 6 (six) hours as needed for wheezing or shortness of breath. 01/29/21  Yes Causey, Larna Daughters, NP  oxyCODONE (OXY IR/ROXICODONE) 5 MG immediate release tablet Take 1-2 tablets (5-10 mg total) by mouth every 4 (four) hours as needed for moderate pain (pain score 4-6). 02/20/21  Yes Dedra Skeens, PA-C  traMADol (ULTRAM) 50 MG tablet Take 1 tablet (50 mg total) by mouth every 6 (six) hours as needed for moderate pain. 02/20/21  Yes Dedra Skeens, PA-C  aspirin EC 325 MG EC tablet Take 1 tablet (325 mg total) by mouth daily. 02/20/21 03/22/21  Dedra Skeens, PA-C  methocarbamol (ROBAXIN) 500 MG tablet Take 1 tablet (500 mg total) by mouth every 6 (six) hours as needed for up to 20 doses for muscle spasms. 02/20/21   Dedra Skeens, PA-C  ondansetron (ZOFRAN) 4 MG tablet Take 1 tablet (4 mg total) by mouth every 6 (six) hours as needed for nausea. 02/20/21   Dedra Skeens, PA-C    Allergies Patient has no known allergies.    Social History Social History   Tobacco Use  . Smoking status: Never Smoker  . Smokeless  tobacco: Never Used  Vaping Use  . Vaping Use: Never used  Substance Use Topics  . Alcohol use: No  . Drug use: No    Review of Systems Patient denies headaches, rhinorrhea, blurry vision, numbness, shortness of breath, chest pain, edema, cough, abdominal pain, nausea, vomiting, diarrhea, dysuria, fevers, rashes or hallucinations unless otherwise stated above in HPI. ____________________________________________   PHYSICAL EXAM:  VITAL SIGNS: Vitals:   03/01/21 1412 03/01/21 1457  BP: (!) 158/102 129/82  Pulse: 99 (!) 104  Resp: 16 16  Temp: 98.6 F (37 C) 99.7 F (37.6  C)  SpO2: 98% 97%    Constitutional: Alert and oriented.  Eyes: Conjunctivae are normal.  Head: Atraumatic. Nose: No congestion/rhinnorhea. Mouth/Throat: Mucous membranes are moist.   Neck: No stridor. Painless ROM.  Cardiovascular: Normal rate, regular rhythm. Grossly normal heart sounds.  Good peripheral circulation. Respiratory: tachypnea with shallow inspirations.  No retractions. Lungs with diminish bs throughout 2/2 poor inspiratory effort. Gastrointestinal: Soft and nontender. No distention. No abdominal bruits. No CVA tenderness. Genitourinary:  Musculoskeletal: No lower extremity tenderness nor edema.  No joint effusions. Neurologic:  Normal speech and language. No gross focal neurologic deficits are appreciated. No facial droop Skin:  Skin is warm, dry and intact. No rash noted. Psychiatric: Mood and affect are normal. Speech and behavior are normal.  ____________________________________________   LABS (all labs ordered are listed, but only abnormal results are displayed)  Results for orders placed or performed during the hospital encounter of 03/01/21 (from the past 24 hour(s))  Basic metabolic panel     Status: Abnormal   Collection Time: 03/01/21  9:33 AM  Result Value Ref Range   Sodium 135 135 - 145 mmol/L   Potassium 4.1 3.5 - 5.1 mmol/L   Chloride 101 98 - 111 mmol/L   CO2 23 22 - 32 mmol/L   Glucose, Bld 102 (H) 70 - 99 mg/dL   BUN 14 6 - 20 mg/dL   Creatinine, Ser 9.50 0.61 - 1.24 mg/dL   Calcium 9.6 8.9 - 93.2 mg/dL   GFR, Estimated >67 >12 mL/min   Anion gap 11 5 - 15  CBC     Status: Abnormal   Collection Time: 03/01/21  9:33 AM  Result Value Ref Range   WBC 13.8 (H) 4.0 - 10.5 K/uL   RBC 4.31 4.22 - 5.81 MIL/uL   Hemoglobin 11.5 (L) 13.0 - 17.0 g/dL   HCT 45.8 (L) 09.9 - 83.3 %   MCV 82.6 80.0 - 100.0 fL   MCH 26.7 26.0 - 34.0 pg   MCHC 32.3 30.0 - 36.0 g/dL   RDW 82.5 05.3 - 97.6 %   Platelets 376 150 - 400 K/uL   nRBC 0.0 0.0 - 0.2 %   Troponin I (High Sensitivity)     Status: None   Collection Time: 03/01/21  9:33 AM  Result Value Ref Range   Troponin I (High Sensitivity) 3 <18 ng/L  Hepatic function panel     Status: Abnormal   Collection Time: 03/01/21  9:33 AM  Result Value Ref Range   Total Protein 7.9 6.5 - 8.1 g/dL   Albumin 3.6 3.5 - 5.0 g/dL   AST 50 (H) 15 - 41 U/L   ALT 76 (H) 0 - 44 U/L   Alkaline Phosphatase 124 38 - 126 U/L   Total Bilirubin 1.4 (H) 0.3 - 1.2 mg/dL   Bilirubin, Direct 0.3 (H) 0.0 - 0.2 mg/dL   Indirect Bilirubin 1.1 (H)  0.3 - 0.9 mg/dL  Resp Panel by RT-PCR (Flu A&B, Covid) Nasopharyngeal Swab     Status: None   Collection Time: 03/01/21 11:25 AM   Specimen: Nasopharyngeal Swab; Nasopharyngeal(NP) swabs in vial transport medium  Result Value Ref Range   SARS Coronavirus 2 by RT PCR NEGATIVE NEGATIVE   Influenza A by PCR NEGATIVE NEGATIVE   Influenza B by PCR NEGATIVE NEGATIVE  Troponin I (High Sensitivity)     Status: None   Collection Time: 03/01/21  1:39 PM  Result Value Ref Range   Troponin I (High Sensitivity) <2 <18 ng/L  Lactic acid, plasma     Status: None   Collection Time: 03/01/21  1:39 PM  Result Value Ref Range   Lactic Acid, Venous 1.4 0.5 - 1.9 mmol/L   ____________________________________________  EKG My review and personal interpretation at Time: 9:30   Indication: chest pain  Rate: 100  Rhythm: sinus Axis: normal Other: normal intervals, no stemi ____________________________________________  RADIOLOGY  I personally reviewed all radiographic images ordered to evaluate for the above acute complaints and reviewed radiology reports and findings.  These findings were personally discussed with the patient.  Please see medical record for radiology report.  ____________________________________________   PROCEDURES  Procedure(s) performed:  Procedures    Critical Care performed: no ____________________________________________   INITIAL IMPRESSION /  ASSESSMENT AND PLAN / ED COURSE  Pertinent labs & imaging results that were available during my care of the patient were reviewed by me and considered in my medical decision making (see chart for details).   DDX: ACS, pe, dissection, pna, bronchitis, costochondritis   Todd Ward is a 20 y.o. who presents to the ED with presentation as described above.  Patient febrile white count mildly tachycardic O2 saturations in the low 90s.  Does appear to be splinting.  Given recent surgery ultrasound ordered evaluate for DVT shows none.  CTA ordered to evaluate for PE shows no evidence of central PE but does have evidence of multifocal lobar and bronchopneumonia.  Given recent patient OR intubation at higher risk for ventilator associated pneumonia will give broad-spectrum antibiotics.  Patient given IV pain medication.  Will discuss with hospitalist given his presentation for further evaluation management.     The patient was evaluated in Emergency Department today for the symptoms described in the history of present illness. He/she was evaluated in the context of the global COVID-19 pandemic, which necessitated consideration that the patient might be at risk for infection with the SARS-CoV-2 virus that causes COVID-19. Institutional protocols and algorithms that pertain to the evaluation of patients at risk for COVID-19 are in a state of rapid change based on information released by regulatory bodies including the CDC and federal and state organizations. These policies and algorithms were followed during the patient's care in the ED.  As part of my medical decision making, I reviewed the following data within the electronic MEDICAL RECORD NUMBER Nursing notes reviewed and incorporated, Labs reviewed, notes from prior ED visits and [place] Controlled Substance Database   ____________________________________________   FINAL CLINICAL IMPRESSION(S) / ED DIAGNOSES  Final diagnoses:  Bronchopneumonia      NEW  MEDICATIONS STARTED DURING THIS VISIT:  Current Discharge Medication List       Note:  This document was prepared using Dragon voice recognition software and may include unintentional dictation errors.    Willy Eddy, MD 03/01/21 [phone]

## 2021-03-01 NOTE — ED Notes (Signed)
Pt with increased RR at times, pt states this is due to pain.

## 2021-03-01 NOTE — Plan of Care (Signed)

## 2021-03-02 ENCOUNTER — Other Ambulatory Visit: Payer: Self-pay | Admitting: Orthopedic Surgery

## 2021-03-02 DIAGNOSIS — Z9889 Other specified postprocedural states: Secondary | ICD-10-CM

## 2021-03-02 DIAGNOSIS — R079 Chest pain, unspecified: Secondary | ICD-10-CM

## 2021-03-02 LAB — CBC
HCT: 34.8 % — ABNORMAL LOW (ref 39.0–52.0)
Hemoglobin: 11.3 g/dL — ABNORMAL LOW (ref 13.0–17.0)
MCH: 27.1 pg (ref 26.0–34.0)
MCHC: 32.5 g/dL (ref 30.0–36.0)
MCV: 83.5 fL (ref 80.0–100.0)
Platelets: 365 10*3/uL (ref 150–400)
RBC: 4.17 MIL/uL — ABNORMAL LOW (ref 4.22–5.81)
RDW: 13.4 % (ref 11.5–15.5)
WBC: 14.7 10*3/uL — ABNORMAL HIGH (ref 4.0–10.5)
nRBC: 0 % (ref 0.0–0.2)

## 2021-03-02 LAB — BASIC METABOLIC PANEL
Anion gap: 11 (ref 5–15)
BUN: 12 mg/dL (ref 6–20)
CO2: 23 mmol/L (ref 22–32)
Calcium: 9.3 mg/dL (ref 8.9–10.3)
Chloride: 104 mmol/L (ref 98–111)
Creatinine, Ser: 0.81 mg/dL (ref 0.61–1.24)
GFR, Estimated: >60 mL/min (ref >60)
Glucose, Bld: 91 mg/dL (ref 70–99)
Potassium: 3.9 mmol/L (ref 3.5–5.1)
Sodium: 138 mmol/L (ref 135–145)

## 2021-03-02 LAB — HEMOGLOBIN A1C
Hgb A1c MFr Bld: 5 % (ref 4.8–5.6)
Mean Plasma Glucose: 96.8 mg/dL

## 2021-03-02 LAB — PROCALCITONIN: Procalcitonin: <0.1 ng/mL

## 2021-03-02 LAB — GLUCOSE, CAPILLARY: Glucose-Capillary: 81 mg/dL (ref 70–99)

## 2021-03-02 LAB — HEPARIN LEVEL (UNFRACTIONATED): Heparin Unfractionated: 0.28 IU/mL — ABNORMAL LOW (ref 0.30–0.70)

## 2021-03-02 MED ORDER — APIXABAN 5 MG PO TABS: 5.0000 mg | ORAL_TABLET | Freq: Two times a day (BID) | ORAL | Status: DC

## 2021-03-02 MED ORDER — KETOROLAC TROMETHAMINE 30 MG/ML IJ SOLN
30.0000 mg | Freq: Three times a day (TID) | INTRAMUSCULAR | Status: DC | PRN
Start: 2021-03-02 — End: 2021-03-02

## 2021-03-02 MED ORDER — SODIUM CHLORIDE 0.9 % IV BOLUS
1000.0000 mL | Freq: Once | INTRAVENOUS | Status: AC
  Administered 2021-03-02: 1000 mL via INTRAVENOUS

## 2021-03-02 MED ORDER — OXYCODONE HCL 5 MG PO TABS: 5.0000 mg | ORAL_TABLET | ORAL | Status: DC | PRN

## 2021-03-02 MED ORDER — ACETAMINOPHEN 325 MG PO TABS
650.0000 mg | ORAL_TABLET | Freq: Four times a day (QID) | ORAL | Status: DC | PRN
  Administered 2021-03-02 – 2021-03-04 (×4): 650 mg via ORAL
  Filled 2021-03-02 (×4): qty 2

## 2021-03-02 MED ORDER — SODIUM CHLORIDE 0.9 % IV SOLN: INTRAVENOUS | Status: DC

## 2021-03-02 MED ORDER — HEPARIN BOLUS VIA INFUSION
2100.0000 [IU] | Freq: Once | INTRAVENOUS | Status: AC
  Administered 2021-03-02: 2100 [IU] via INTRAVENOUS
  Filled 2021-03-02: qty 2100

## 2021-03-02 MED ORDER — APIXABAN 5 MG PO TABS
10.0000 mg | ORAL_TABLET | Freq: Two times a day (BID) | ORAL | Status: DC
  Administered 2021-03-02 – 2021-03-05 (×7): 10 mg via ORAL
  Filled 2021-03-02 (×8): qty 2

## 2021-03-02 MED ORDER — HYDROCODONE-ACETAMINOPHEN 5-325 MG PO TABS
1.0000 | ORAL_TABLET | ORAL | Status: DC | PRN
  Administered 2021-03-02 – 2021-03-03 (×6): 2 via ORAL
  Administered 2021-03-03: 1 via ORAL
  Administered 2021-03-04 (×4): 2 via ORAL
  Filled 2021-03-02 (×6): qty 2
  Filled 2021-03-02: qty 1
  Filled 2021-03-02 (×3): qty 2
  Filled 2021-03-02: qty 1
  Filled 2021-03-02: qty 2

## 2021-03-02 NOTE — Consult Note (Signed)
ANTICOAGULATION CONSULT NOTE   Pharmacy Consult for Apixaban Indication: DVT  No Known Allergies  Patient Measurements: Height: 6\' 3"  (190.5 cm) Weight: (!) 207.5 kg (457 lb 8 oz) IBW/kg (Calculated) : 84.5 Heparin Dosing Weight: 138.7 kg  Vital Signs: Temp: 99.2 F (37.3 C) (05/13 1259) Temp Source: Oral (05/13 0754) BP: 132/83 (05/13 1259) Pulse Rate: 93 (05/13 1259)  Labs: Recent Labs    03/01/21 0933 03/01/21 1339 03/01/21 2146 03/02/21 0458  HGB 11.5*  --   --  11.3*  HCT 35.6*  --   --  34.8*  PLT 376  --   --  365  HEPARINUNFRC  --   --  0.12* 0.28*  CREATININE 0.78  --   --  0.81  TROPONINIHS 3 <2 3  --     Estimated Creatinine Clearance: 275.1 mL/min (by C-G formula based on SCr of 0.81 mg/dL).   Medical History: Past Medical History:  Diagnosis Date  . Diabetes mellitus without complication (HCC)    No meds-Last A1C in 2020 was 5.4  . Elevated liver enzymes   . Family history of adverse reaction to anesthesia    PTS TWIN BROTHER-HARD TIME TO WAKE UP  . Hyperlipidemia   . Hypertension    off meds x 3-4 years  . Morbid obesity (HCC)   . Normocytic normochromic anemia   . Obesity     Medications:  Aspirin 325 mg daily for VTE ppx s/p orthopedic surgery No PTA AC  Assessment: 20 y.o. male with medical history significant for morbid obesity (BMI 57), hypertension, status post recent left knee arthroscopy and chondroplasty with loose body removal, MPFL reconstruction using allograft and tibial tubercle osteotomy who presents to the emergency room for evaluation of pleuritic chest pain. LLE 26 significant for DVT. Pharmacy has been consulted for transition from heparin to apixaban dosing. Hgb: 11.5>11.3 and plts: 376>365  Goal of Therapy:  Heparin level 0.3-0.7 units/ml Monitor platelets by anticoagulation protocol: Yes   Plan:  --Heparin infusion stopped and will start Apixaban immediately thereafter --Apixaban 10mg  2 times daily for 7 days,  then 5mg  2 times daily thereafter --daily CBC ordered  US, PharmD 03/02/2021,1:51 PM

## 2021-03-02 NOTE — Progress Notes (Signed)
Pt vital signs done elevated hR of 121 temp of 102, red mews fired, Rapid response called.  Pt complains of chest pain this is not new, same non radiating Dr. Para March informed. EKG done see chart.  Pt medicated with oral anagesic as ordered. Plan for 1L NS bolus and continue IV fluid maintenance. Observation continues

## 2021-03-02 NOTE — TOC Progression Note (Signed)
Transition of Care [facility]) - Progression Note    Patient Details  Name: Todd Ward MRN: [id number] Date of Birth: 16-Jun-2001  Transition of Care [facility]) CM/SW Contact  Caryn Section, RN Phone Number: 03/02/2021, 3:57 PM  Clinical Narrative:   TOC in to see patient and father.  Patient very uncomfortable, so TOC spoke with father.  Patient was set up with bariatric and home equipment last admission, as upon last discharge, TOC was unable to obtain home health.  Father and sister care for patient at home currently.  Father tearful, declined pastoral services at this time, states he just wants to see his son feel better.  TOC contact information given, TOC to follow through discharge.           Expected Discharge Plan and Services                                                 Social Determinants of Health (SDOH) Interventions    Readmission Risk Interventions No flowsheet data found.

## 2021-03-02 NOTE — Progress Notes (Addendum)
Rapid Response Event Note  This RN and Thurston Hole was called in to assess pt who is complaining of chest pain post mobilization.  Reason for Call :  Chest Pain  Initial Focused Assessment:  A= Pt able to speak in full sentence. On 2 L O2. B= RR= 20. Chest expansion symmetrical. Sats=97 C=BP= 121/57 with MAP=77, HR=121 on Atrial Fibrillation. Pulses +2 on extremities. D= Pt complains of chest discomfort when taking breaths. He said       pain is not new and happens when he tries to mobilize.  E= Pallor. Fever of 103.  Interventions:  12 lead EKG done. Dr Para March informed. Advised the nurse to give due pain medications and tylenol for fever. Fluid bolus followed by maintenance Infusion as  per Dr Para March. Will continue to monitor.  Event Summary:   MD Notified: DR Para March Call Time:2000 Arrival Time: End Time:  Todd Comings, RN

## 2021-03-02 NOTE — Progress Notes (Signed)
Karlyn Agee RN and Programmer, multimedia collaborated with Pharmacy Barbara Cower and [facility] Nicki on heparin bolus- 4100 units to infuse over 2 min.  Both stated that it was okay to infuse the heparin bolus over 2 min at a rapid bolus

## 2021-03-02 NOTE — Progress Notes (Signed)
Notified MD of patient status with chest/rib cage pain. New orders received- EKG and give pain meds. RN also instructed pt on use of incentive spirometer due to pneumonia in lungs bilaterally. No other orders.

## 2021-03-02 NOTE — Progress Notes (Signed)
   03/02/21 0135  Assess: MEWS Score  Temp 100.3 F (37.9 C)  BP 132/75  Pulse Rate 99  Resp (!) 36  Level of Consciousness Alert  O2 Device Nasal Cannula  Patient Activity (if Appropriate) In bed  O2 Flow Rate (L/min) 2 L/min  Assess: MEWS Score  MEWS Temp 0  MEWS Systolic 0  MEWS Pulse 0  MEWS RR 3  MEWS LOC 0  MEWS Score 3  MEWS Score Color Yellow  Assess: if the MEWS score is Yellow or Red  Were vital signs taken at a resting state? Yes  Focused Assessment Change from prior assessment (see assessment flowsheet)  Early Detection of Sepsis Score *See Row Information* Medium  MEWS guidelines implemented *See Row Information* No, previously yellow, continue vital signs every 4 hours  Treat  Pain Scale 0-10  Pain Score 10  Pain Type Acute pain  Pain Orientation Left  Pain Descriptors / Indicators Aching  Pain Frequency Constant  Pain Onset On-going  Patients Stated Pain Goal 2  Pain Intervention(s) Medication (See eMAR)  Multiple Pain Sites No  Take Vital Signs  Increase Vital Sign Frequency  Yellow: Q 2hr X 2 then Q 4hr X 2, if remains yellow, continue Q 4hrs  Escalate  MEWS: Escalate Yellow: discuss with charge nurse/RN and consider discussing with provider and RRT  Notify: Charge Nurse/RN  Name of Charge Nurse/RN Notified Debra, RN  Date Charge Nurse/RN Notified 03/02/21  Time Charge Nurse/RN Notified 0150  Notify: Provider  Provider Name/Title Dr Margo Aye  Date Provider Notified 03/02/21  Time Provider Notified 0202  Notification Type Page  Notification Reason Change in status (secure chat)  Provider response See new orders  Date of Provider Response 03/02/21  Time of Provider Response [phone]

## 2021-03-02 NOTE — Progress Notes (Signed)
   03/01/21 2122  Assess: MEWS Score  BP (!) 111/50  Pulse Rate (!) 104  ECG Heart Rate 97  Resp (!) 42  Level of Consciousness Alert  SpO2 99 %  O2 Device Nasal Cannula  Patient Activity (if Appropriate) In bed  O2 Flow Rate (L/min) 2 L/min  Assess: MEWS Score  MEWS Temp 0  MEWS Systolic 0  MEWS Pulse 0  MEWS RR 3  MEWS LOC 0  MEWS Score 3  MEWS Score Color Yellow  Assess: if the MEWS score is Yellow or Red  Were vital signs taken at a resting state? Yes  Focused Assessment Change from prior assessment (see assessment flowsheet)  Early Detection of Sepsis Score *See Row Information* Medium  MEWS guidelines implemented *See Row Information* No, previously yellow, continue vital signs every 4 hours  Treat  Pain Scale 0-10  Pain Score 5  Pain Type Acute pain  Pain Location Rib cage  Pain Orientation Left  Pain Descriptors / Indicators Aching  Pain Frequency Constant  Pain Onset On-going  Patients Stated Pain Goal 2  Pain Intervention(s) Medication (See eMAR)  Multiple Pain Sites No  Take Vital Signs  Increase Vital Sign Frequency  Yellow: Q 2hr X 2 then Q 4hr X 2, if remains yellow, continue Q 4hrs  Escalate  MEWS: Escalate Yellow: discuss with charge nurse/RN and consider discussing with provider and RRT  Notify: Charge Nurse/RN  Name of Charge Nurse/RN Notified Debra, RN  Date Charge Nurse/RN Notified 03/01/21  Time Charge Nurse/RN Notified 2122  Notify: Provider  Provider Name/Title Dr. Margo Aye  Date Provider Notified 03/01/21  Time Provider Notified 2122  Notification Type Page (secure chat)  Notification Reason Change in status (Increased RR and chest/rib cage pain)  Provider response See new orders  Date of Provider Response 03/01/21  Time of Provider Response 2130

## 2021-03-02 NOTE — Progress Notes (Signed)
PROGRESS NOTE    Todd Ward  POE:[id number] DOB: [birth year] DOA: 03/01/2021 PCP: [facility]  133A/133A-AA   Assessment & Plan:   Principal Problem:   CAP (community acquired pneumonia) Active Problems:   DVT (deep venous thrombosis) (HCC)   Diabetes mellitus without complication (HCC)   Morbid obesity (HCC)   Elevated liver enzymes   Todd Ward is a 20 y.o. male with medical history significant for morbid obesity (BMI 57), diet-controlled diabetes mellitus, transaminitis, COVID-19 viral infection (04/22 treated with Paxlovid for 5 days), hypertension, status post recent left knee arthroscopy and chondroplasty with loose body removal, MPFL reconstruction using allograft and tibial tubercle osteotomy who presents to the emergency room for evaluation of pain mostly in the left intercostal area.  Pain is pleuritic and worse with inspiration associated with shortness of breath.     Sepsis 2/2 Community-acquired pneumonia Patient presents for evaluation of pleuritic chest pain and shortness of breath --tachycardia, tachypnea, with leukocytosis on presentation, later also developed a fever. --CT angiogram was negative for PE but shows bronchopneumonia involving the left lower lobe --started on Rocephin and Zithromax Plan: --cont ceftriaxone and azithromycin  Left leg acute DVT, POA Patient is status post recent extensive orthopedic surgery involving his left lower extremity Ultrasound showed a DVT in the posterior tibial vein --started patient on heparin Plan: --transition to Eliquis today  Transaminitis Most likely related to steatohepatitis from morbid obesity  Diabetes mellitus Diet controlled --obtain A1c  Morbid obesity (BMI 57) Complicates overall prognosis and care.  Status post recent Left knee arthroscopy and chondroplasty with loose body removal, MPFL reconstruction using allograft, and tibial tubercle osteotomy on 02/19/21 --with Dr. Allena Katz -  nonweightbearing on that left leg -Pain control --PA Dedra Skeens from ortho will see pt over the weekend   DVT prophylaxis: [id number] Code Status: Full code  Family Communication: father updated at bedside today Level of care: Med-Surg Dispo:   The patient is from: home Anticipated d/c is to: home Anticipated d/c date is: 2-3 days Patient currently is not medically ready to d/c due to: on IV abx for PNA   Subjective and Interval History:  Pt continued to complain about pain in the left chest.  Breathing better.     Objective: Vitals:   03/02/21 0549 03/02/21 0754 03/02/21 1259 03/02/21 1615  BP: 131/76 126/67 132/83 127/78  Pulse: (!) 102 96 93 100  Resp: (!) 21 (!) 30 (!) 28 (!) 22  Temp: 99.3 F (37.4 C) (!) 101 F (38.3 C) 99.2 F (37.3 C) 99.5 F (37.5 C)  TempSrc:  Oral  Oral  SpO2:  97% 97% 95%  Weight:      Height:        Intake/Output Summary (Last 24 hours) at 03/02/2021 1814 Last data filed at 03/02/2021 1455 Gross per 24 hour  Intake 605.83 ml  Output 450 ml  Net 155.83 ml   Filed Weights   03/01/21 0920 03/01/21 0944  Weight: (!) 215.9 kg (!) 207.5 kg    Examination:   Constitutional: NAD, AAOx3, sleepy HEENT: conjunctivae and lids normal, EOMI CV: No cyanosis.   RESP: normal respiratory effort, on 2L Extremities: brace over left knee SKIN: warm, dry Neuro: II - XII grossly intact.   Psych: depressed mood and affect.     Data Reviewed: I have personally reviewed following labs and imaging studies  CBC: Recent Labs  Lab 03/01/21 0933 03/02/21 0458  WBC 13.8* 14.7*  HGB 11.5* 11.3*  HCT  35.6* 34.8*  MCV 82.6 83.5  PLT 376 365   Basic Metabolic Panel: Recent Labs  Lab 03/01/21 0933 03/02/21 0458  NA 135 138  K 4.1 3.9  CL 101 104  CO2 23 23  GLUCOSE 102* 91  BUN 14 12  CREATININE 0.78 0.81  CALCIUM 9.6 9.3   GFR: Estimated Creatinine Clearance: 275.1 mL/min (by C-G formula based on SCr of 0.81 mg/dL). Liver Function  Tests: Recent Labs  Lab 03/01/21 0933  AST 50*  ALT 76*  ALKPHOS 124  BILITOT 1.4*  PROT 7.9  ALBUMIN 3.6   No results for input(s): LIPASE, AMYLASE in the last 168 hours. No results for input(s): AMMONIA in the last 168 hours. Coagulation Profile: No results for input(s): INR, PROTIME in the last 168 hours. Cardiac Enzymes: No results for input(s): CKTOTAL, CKMB, CKMBINDEX, TROPONINI in the last 168 hours. BNP (last 3 results) No results for input(s): PROBNP in the last 8760 hours. HbA1C: No results for input(s): HGBA1C in the last 72 hours. CBG: Recent Labs  Lab 03/01/21 1605 03/01/21 2212  GLUCAP 92 82   Lipid Profile: No results for input(s): CHOL, HDL, LDLCALC, TRIG, CHOLHDL, LDLDIRECT in the last 72 hours. Thyroid Function Tests: No results for input(s): TSH, T4TOTAL, FREET4, T3FREE, THYROIDAB in the last 72 hours. Anemia Panel: No results for input(s): VITAMINB12, FOLATE, FERRITIN, TIBC, IRON, RETICCTPCT in the last 72 hours. Sepsis Labs: Recent Labs  Lab 03/01/21 1339 03/02/21 0458  PROCALCITON  --  <0.10  LATICACIDVEN 1.4  --     Recent Results (from the past 240 hour(s))  Blood culture (routine x 2)     Status: None (Preliminary result)   Collection Time: 03/01/21  9:59 AM   Specimen: BLOOD  Result Value Ref Range Status   Specimen Description BLOOD BLOOD LEFT FOREARM  Final   Special Requests   Final    BOTTLES DRAWN AEROBIC AND ANAEROBIC Blood Culture results may not be optimal due to an excessive volume of blood received in culture bottles   Culture   Final    NO GROWTH < 24 HOURS Performed at [facility], [address] [place], [place] [address]    Report Status PENDING  Incomplete  Blood culture (routine x 2)     Status: None (Preliminary result)   Collection Time: 03/01/21 10:27 AM   Specimen: BLOOD  Result Value Ref Range Status   Specimen Description BLOOD BLOOD LEFT HAND  Final   Special Requests   Final    BOTTLES DRAWN  AEROBIC AND ANAEROBIC Blood Culture adequate volume   Culture   Final    NO GROWTH < 24 HOURS Performed at [facility], [address] [place], [place] [address]    Report Status PENDING  Incomplete  Resp Panel by RT-PCR (Flu A&B, Covid) Nasopharyngeal Swab     Status: None   Collection Time: 03/01/21 11:25 AM   Specimen: Nasopharyngeal Swab; Nasopharyngeal(NP) swabs in vial transport medium  Result Value Ref Range Status   SARS Coronavirus 2 by RT PCR NEGATIVE NEGATIVE Final    Comment: (NOTE) SARS-CoV-2 target nucleic acids are NOT DETECTED.  The SARS-CoV-2 RNA is generally detectable in upper respiratory specimens during the acute phase of infection. The lowest concentration of SARS-CoV-2 viral copies this assay can detect is 138 copies/mL. A negative result does not preclude SARS-Cov-2 infection and should not be used as the sole basis for treatment or other patient management decisions. A negative result may  occur with  improper specimen collection/handling, submission of specimen other than nasopharyngeal swab, presence of viral mutation(s) within the areas targeted by this assay, and inadequate number of viral copies(<138 copies/mL). A negative result must be combined with clinical observations, patient history, and epidemiological information. The expected result is Negative.  Fact Sheet for Patients:  BloggerCourse.comhttps://www.fda.gov/media/152166/download  Fact Sheet for Healthcare Providers:  SeriousBroker.ithttps://www.fda.gov/media/152162/download  This test is no t yet approved or cleared by the [place] FDA and  has been authorized for detection and/or diagnosis of SARS-CoV-2 by FDA under an Emergency Use Authorization (EUA). This EUA will remain  in effect (meaning this test can be used) for the duration of the COVID-19 declaration under Section 564(b)(1) of the Act, 21 U.S.C.section 360bbb-3(b)(1), unless the authorization is terminated  or revoked sooner.        Influenza A by PCR NEGATIVE NEGATIVE Final   Influenza B by PCR NEGATIVE NEGATIVE Final    Comment: (NOTE) The Xpert Xpress SARS-CoV-2/FLU/RSV plus assay is intended as an aid in the diagnosis of influenza from Nasopharyngeal swab specimens and should not be used as a sole basis for treatment. Nasal washings and aspirates are unacceptable for Xpert Xpress SARS-CoV-2/FLU/RSV testing.  Fact Sheet for Patients: BloggerCourse.comhttps://www.fda.gov/media/152166/download  Fact Sheet for Healthcare Providers: SeriousBroker.ithttps://www.fda.gov/media/152162/download  This test is not yet approved or cleared by the [place] FDA and has been authorized for detection and/or diagnosis of SARS-CoV-2 by FDA under an Emergency Use Authorization (EUA). This EUA will remain in effect (meaning this test can be used) for the duration of the COVID-19 declaration under Section 564(b)(1) of the Act, 21 U.S.C. section 360bbb-3(b)(1), unless the authorization is terminated or revoked.  Performed at [facility], [address] [place], [place] [id number]       Radiology Studies: CT Angio Chest PE W and/or Wo Contrast  Result Date: 03/01/2021 CLINICAL DATA:  High probability for pulmonary embolism EXAM: CT ANGIOGRAPHY CHEST WITH CONTRAST TECHNIQUE: Multidetector CT imaging of the chest was performed using the standard protocol during bolus administration of intravenous contrast. Multiplanar CT image reconstructions and MIPs were obtained to evaluate the vascular anatomy. CONTRAST:  100mL OMNIPAQUE IOHEXOL 350 MG/ML SOLN COMPARISON:  None. FINDINGS: Cardiovascular: Satisfactory opacification of the pulmonary arteries to the proximal segmental level. Advanced respiratory motion artifact limits evaluation of the more distal pulmonary arterial tree. No evidence of pulmonary embolism. Normal heart size. No pericardial effusion. Mediastinum/Nodes: No enlarged mediastinal, hilar, or axillary lymph nodes. Thyroid gland, trachea, and  esophagus demonstrate no significant findings. Lungs/Pleura: Marked respiratory motion artifact. Dependent atelectasis present bilaterally. More focal airspace opacity present within the medial basilar segment of the left lower lobe. Findings are concerning for bronchopneumonia. Upper Abdomen: No acute abnormality. Musculoskeletal: No chest wall abnormality. No acute or significant osseous findings. Review of the MIP images confirms the above findings. IMPRESSION: 1. No evidence of acute pulmonary embolus to the proximal segmental level. Marked respiratory motion artifact limits evaluation of the more peripheral pulmonary arterial tree. 2. Focal patchy airspace opacity in the medial basal segment of the left lower lobe concerning for bronchopneumonia. 3. Dependent atelectasis bilaterally. Electronically Signed   By: Malachy MoanHeath  McCullough M.D.   On: 03/01/2021 12:12   US Venous Img Lower Unilateral Left  Result Date: 03/01/2021 CLINICAL DATA:  20 year old male with left lower extremity pain EXAM: LEFT LOWER EXTREMITY VENOUS DOPPLER ULTRASOUND TECHNIQUE: Gray-scale sonography with graded compression, as well as color Doppler and duplex ultrasound were performed to evaluate the lower extremity  deep venous systems from the level of the common femoral vein and including the common femoral, femoral, profunda femoral, popliteal and calf veins including the posterior tibial, peroneal and gastrocnemius veins when visible. The superficial great saphenous vein was also interrogated. Spectral Doppler was utilized to evaluate flow at rest and with distal augmentation maneuvers in the common femoral, femoral and popliteal veins. COMPARISON:  None. FINDINGS: Contralateral Common Femoral Vein: Respiratory phasicity is normal and symmetric with the symptomatic side. No evidence of thrombus. Normal compressibility. Common Femoral Vein: No evidence of thrombus. Normal compressibility, respiratory phasicity and response to  augmentation. Saphenofemoral Junction: No evidence of thrombus. Normal compressibility and flow on color Doppler imaging. Profunda Femoral Vein: No evidence of thrombus. Normal compressibility and flow on color Doppler imaging. Femoral Vein: No evidence of thrombus. Normal compressibility, respiratory phasicity and response to augmentation. Popliteal Vein: No evidence of thrombus. Normal compressibility, respiratory phasicity and response to augmentation. Calf Veins: Absence of flow within at least 1 of the paired posterior tibial veins. The vessel lumen is expanded and filled with low-level internal echoes. No evidence of color flow on color Doppler. Additionally, there is poor visualization of the peroneal veins. No definite color flow on color Doppler imaging. Suspect additional calf DVT. Superficial Great Saphenous Vein: No evidence of thrombus. Normal compressibility. Venous Reflux:  None. Other Findings:  None. IMPRESSION: Positive for occlusive deep venous thrombosis involving at least 1 of the paired posterior tibial veins in the calf. Suspect additional calf vein DVT as well. No evidence of DVT extension above the calf. Electronically Signed   By: Malachy Moan M.D.   On: 03/01/2021 12:06   DG Chest Port 1 View  Result Date: 03/01/2021 CLINICAL DATA:  Intercostal pain Weakness and shortness of breath Recent left knee surgery EXAM: PORTABLE CHEST 1 VIEW COMPARISON:  None. FINDINGS: Evaluation limited due to expiratory phase of imaging. The heart appears mildly enlarged with mild pulmonary vascular congestion. Mild bibasilar strandy opacities likely a combination of atelectasis and pulmonary edema. IMPRESSION: Constellation of findings most consistent with CHF. Electronically Signed   By: Acquanetta Belling M.D.   On: 03/01/2021 10:34     Scheduled Meds: . apixaban  10 mg Oral BID   Followed by  . [START ON 03/09/2021] apixaban  5 mg Oral BID  . senna  1 tablet Oral QHS   Continuous Infusions: .  azithromycin Stopped (03/01/21 1829)  . cefTRIAXone (ROCEPHIN)  IV Stopped (03/01/21 2355)  . sodium chloride       LOS: 1 day     Darlin Priestly, MD [facility] If 7PM-7AM, please contact night-coverage 03/02/2021, 6:14 PM

## 2021-03-02 NOTE — Progress Notes (Signed)
MD messaged to inform pt of current vitals (increased temp, RR, HR, and need for 2L [place]) as well as the increasing wbc. Pt has been a yellow mews of 2-3 over night. RN suggested possibly moving to a higher acuity floor. MD stated pt has pneumonia and is on antibiotics and is stable and no need to move. Pt is having pain and SOB, encourage IS use, father in room making pt use every hour at this time. RN gave tylenol, robaxin, and oxy PRN at this time.

## 2021-03-03 ENCOUNTER — Inpatient Hospital Stay: Payer: PRIVATE HEALTH INSURANCE

## 2021-03-03 ENCOUNTER — Other Ambulatory Visit: Payer: Self-pay | Admitting: Orthopedic Surgery

## 2021-03-03 DIAGNOSIS — Z9889 Other specified postprocedural states: Secondary | ICD-10-CM

## 2021-03-03 LAB — BASIC METABOLIC PANEL
Anion gap: 10 (ref 5–15)
BUN: 11 mg/dL (ref 6–20)
CO2: 23 mmol/L (ref 22–32)
Calcium: 8.7 mg/dL — ABNORMAL LOW (ref 8.9–10.3)
Chloride: 102 mmol/L (ref 98–111)
Creatinine, Ser: 0.71 mg/dL (ref 0.61–1.24)
GFR, Estimated: >60 mL/min (ref >60)
Glucose, Bld: 98 mg/dL (ref 70–99)
Potassium: 3.7 mmol/L (ref 3.5–5.1)
Sodium: 135 mmol/L (ref 135–145)

## 2021-03-03 LAB — CBC
HCT: 30.2 % — ABNORMAL LOW (ref 39.0–52.0)
Hemoglobin: 9.9 g/dL — ABNORMAL LOW (ref 13.0–17.0)
MCH: 27 pg (ref 26.0–34.0)
MCHC: 32.8 g/dL (ref 30.0–36.0)
MCV: 82.5 fL (ref 80.0–100.0)
Platelets: 315 10*3/uL (ref 150–400)
RBC: 3.66 MIL/uL — ABNORMAL LOW (ref 4.22–5.81)
RDW: 13.2 % (ref 11.5–15.5)
WBC: 13.1 10*3/uL — ABNORMAL HIGH (ref 4.0–10.5)
nRBC: 0 % (ref 0.0–0.2)

## 2021-03-03 LAB — GLUCOSE, CAPILLARY
Glucose-Capillary: 101 mg/dL — ABNORMAL HIGH (ref 70–99)
Glucose-Capillary: 111 mg/dL — ABNORMAL HIGH (ref 70–99)
Glucose-Capillary: 101 mg/dL — ABNORMAL HIGH (ref 70–99)
Glucose-Capillary: 93 mg/dL (ref 70–99)

## 2021-03-03 LAB — MAGNESIUM: Magnesium: 1.8 mg/dL (ref 1.7–2.4)

## 2021-03-03 NOTE — Progress Notes (Signed)
Pt staples and sutures removed per orders. Pt had some pain with removal, but overall tolerated well. Dry dressing applied.

## 2021-03-03 NOTE — Progress Notes (Signed)
PROGRESS NOTE    Todd Ward  ENI:[id number] DOB: Jul 28, 2001 DOA: 03/01/2021 PCP: [facility]  133A/133A-AA   Assessment & Plan:   Principal Problem:   CAP (community acquired pneumonia) Active Problems:   DVT (deep venous thrombosis) (HCC)   Diabetes mellitus without complication (HCC)   Morbid obesity (HCC)   Elevated liver enzymes   Todd Ward is a 20 y.o. male with medical history significant for morbid obesity (BMI 57), diet-controlled diabetes mellitus, transaminitis, COVID-19 viral infection (04/22 treated with Paxlovid for 5 days), hypertension, status post recent left knee arthroscopy and chondroplasty with loose body removal, MPFL reconstruction using allograft and tibial tubercle osteotomy who presents to the emergency room for evaluation of pain mostly in the left intercostal area.  Pain is pleuritic and worse with inspiration associated with shortness of breath.     Sepsis 2/2 Community-acquired pneumonia Patient presents for evaluation of pleuritic chest pain and shortness of breath --tachycardia, tachypnea, with leukocytosis on presentation, later also developed a fever. --CT angiogram was negative for PE but shows bronchopneumonia involving the left lower lobe --started on Rocephin and Zithromax Plan: --cont ceftriaxone and azithromycin  Left leg acute DVT, POA Patient is status post recent extensive orthopedic surgery involving his left lower extremity Ultrasound showed a DVT in the posterior tibial vein --started patient on heparin, transitioned to Eliquis Plan: --cont Eliquis  Transaminitis Most likely related to steatohepatitis from morbid obesity  Diabetes mellitus, ruled out --A1c 5.0.  No abnormal A1c on record.  Morbid obesity (BMI 57) Complicates overall prognosis and care. --discussed body habitus interfering with lung expansion  Status post recent Left knee arthroscopy and chondroplasty with loose body removal, MPFL  reconstruction using allograft, and tibial tubercle osteotomy on 02/19/21 --with Dr. Allena Katz Plan: --wound check by ortho today --cont knee immobilizer - nonweightbearing on that left leg -Pain control   DVT prophylaxis: IR:[id number] Code Status: Full code  Family Communication:  Level of care: Med-Surg Dispo:   The patient is from: home Anticipated d/c is to: home Anticipated d/c date is: 1-2 days Patient currently is not medically ready to d/c due to: on IV abx for PNA   Subjective and Interval History:  Had rapid call last night due to chest pain.  Today, pt reported chest pain improved, but still painful when taking deep breaths.  Dyspnea improved.  Voided.  Ortho checked left knee today.   Objective: Vitals:   03/03/21 0003 03/03/21 0134 03/03/21 0329 03/03/21 0720  BP: (!) 122/59 (!) 121/56 107/61 124/68  Pulse: (!) 109 (!) 110 (!) 104 100  Resp: 20 20 20 20   Temp: 99.5 F (37.5 C) (!) 100.7 F (38.2 C) 99.8 F (37.7 C) 99.5 F (37.5 C)  TempSrc:  Oral    SpO2: 95% 96% 97% 97%  Weight:      Height:        Intake/Output Summary (Last 24 hours) at 03/03/2021 1614 Last data filed at 03/03/2021 1500 Gross per 24 hour  Intake 2286.42 ml  Output 600 ml  Net 1686.42 ml   Filed Weights   03/01/21 0920 03/01/21 0944  Weight: (!) 215.9 kg (!) 207.5 kg    Examination:   Constitutional: NAD, AAOx3 HEENT: conjunctivae and lids normal, EOMI CV: No cyanosis.   RESP: cough with deep breathing, on 2L Extremities: Left knee in brace SKIN: warm, dry Neuro: II - XII grossly intact.     Data Reviewed: I have personally reviewed following labs and imaging studies  CBC: Recent Labs  Lab 03/01/21 0933 03/02/21 0458 03/03/21 0430  WBC 13.8* 14.7* 13.1*  HGB 11.5* 11.3* 9.9*  HCT 35.6* 34.8* 30.2*  MCV 82.6 83.5 82.5  PLT 376 365 315   Basic Metabolic Panel: Recent Labs  Lab 03/01/21 0933 03/02/21 0458 03/03/21 0430  NA 135 138 135  K 4.1 3.9 3.7  CL 101  104 102  CO2 23 23 23   GLUCOSE 102* 91 98  BUN 14 12 11   CREATININE 0.78 0.81 0.71  CALCIUM 9.6 9.3 8.7*  MG  --   --  1.8   GFR: Estimated Creatinine Clearance: 278.5 mL/min (by C-G formula based on SCr of 0.71 mg/dL). Liver Function Tests: Recent Labs  Lab 03/01/21 0933  AST 50*  ALT 76*  ALKPHOS 124  BILITOT 1.4*  PROT 7.9  ALBUMIN 3.6   No results for input(s): LIPASE, AMYLASE in the last 168 hours. No results for input(s): AMMONIA in the last 168 hours. Coagulation Profile: No results for input(s): INR, PROTIME in the last 168 hours. Cardiac Enzymes: No results for input(s): CKTOTAL, CKMB, CKMBINDEX, TROPONINI in the last 168 hours. BNP (last 3 results) No results for input(s): PROBNP in the last 8760 hours. HbA1C: Recent Labs    03/02/21 0458  HGBA1C 5.0   CBG: Recent Labs  Lab 03/01/21 1605 03/01/21 2212 03/02/21 2107 03/03/21 0721 03/03/21 1130  GLUCAP 92 82 81 101* 111*   Lipid Profile: No results for input(s): CHOL, HDL, LDLCALC, TRIG, CHOLHDL, LDLDIRECT in the last 72 hours. Thyroid Function Tests: No results for input(s): TSH, T4TOTAL, FREET4, T3FREE, THYROIDAB in the last 72 hours. Anemia Panel: No results for input(s): VITAMINB12, FOLATE, FERRITIN, TIBC, IRON, RETICCTPCT in the last 72 hours. Sepsis Labs: Recent Labs  Lab 03/01/21 1339 03/02/21 0458  PROCALCITON  --  <0.10  LATICACIDVEN 1.4  --     Recent Results (from the past 240 hour(s))  Blood culture (routine x 2)     Status: None (Preliminary result)   Collection Time: 03/01/21  9:59 AM   Specimen: BLOOD  Result Value Ref Range Status   Specimen Description BLOOD BLOOD LEFT FOREARM  Final   Special Requests   Final    BOTTLES DRAWN AEROBIC AND ANAEROBIC Blood Culture results may not be optimal due to an excessive volume of blood received in culture bottles   Culture   Final    NO GROWTH 2 DAYS Performed at [facility], [address] [place], [address] [place]     Report Status PENDING  Incomplete  Blood culture (routine x 2)     Status: None (Preliminary result)   Collection Time: 03/01/21 10:27 AM   Specimen: BLOOD  Result Value Ref Range Status   Specimen Description BLOOD BLOOD LEFT HAND  Final   Special Requests   Final    BOTTLES DRAWN AEROBIC AND ANAEROBIC Blood Culture adequate volume   Culture   Final    NO GROWTH 2 DAYS Performed at [facility], [address], [place], [address] [place]    Report Status PENDING  Incomplete  Resp Panel by RT-PCR (Flu A&B, Covid) Nasopharyngeal Swab     Status: None   Collection Time: 03/01/21 11:25 AM   Specimen: Nasopharyngeal Swab; Nasopharyngeal(NP) swabs in vial transport medium  Result Value Ref Range Status   SARS Coronavirus 2 by RT PCR NEGATIVE NEGATIVE Final    Comment: (NOTE) SARS-CoV-2 target nucleic acids are NOT DETECTED.  The SARS-CoV-2 RNA is  generally detectable in upper respiratory specimens during the acute phase of infection. The lowest concentration of SARS-CoV-2 viral copies this assay can detect is 138 copies/mL. A negative result does not preclude SARS-Cov-2 infection and should not be used as the sole basis for treatment or other patient management decisions. A negative result may occur with  improper specimen collection/handling, submission of specimen other than nasopharyngeal swab, presence of viral mutation(s) within the areas targeted by this assay, and inadequate number of viral copies(<138 copies/mL). A negative result must be combined with clinical observations, patient history, and epidemiological information. The expected result is Negative.  Fact Sheet for Patients:  BloggerCourse.com  Fact Sheet for Healthcare Providers:  SeriousBroker.it  This test is no t yet approved or cleared by the [place] FDA and  has been authorized for detection and/or diagnosis of SARS-CoV-2 by FDA under an  Emergency Use Authorization (EUA). This EUA will remain  in effect (meaning this test can be used) for the duration of the COVID-19 declaration under Section 564(b)(1) of the Act, 21 U.S.C.section 360bbb-3(b)(1), unless the authorization is terminated  or revoked sooner.       Influenza A by PCR NEGATIVE NEGATIVE Final   Influenza B by PCR NEGATIVE NEGATIVE Final    Comment: (NOTE) The Xpert Xpress SARS-CoV-2/FLU/RSV plus assay is intended as an aid in the diagnosis of influenza from Nasopharyngeal swab specimens and should not be used as a sole basis for treatment. Nasal washings and aspirates are unacceptable for Xpert Xpress SARS-CoV-2/FLU/RSV testing.  Fact Sheet for Patients: BloggerCourse.com  Fact Sheet for Healthcare Providers: SeriousBroker.it  This test is not yet approved or cleared by the [place] FDA and has been authorized for detection and/or diagnosis of SARS-CoV-2 by FDA under an Emergency Use Authorization (EUA). This EUA will remain in effect (meaning this test can be used) for the duration of the COVID-19 declaration under Section 564(b)(1) of the Act, 21 U.S.C. section 360bbb-3(b)(1), unless the authorization is terminated or revoked.  Performed at [facility], [address] [place], [place] [address]       Radiology Studies: DG Knee Left Port  Result Date: 03/03/2021 CLINICAL DATA:  History of arthroscopy and surgery of the LEFT knee. EXAM: PORTABLE LEFT KNEE - 1-2 VIEW COMPARISON:  02/19/2021 FINDINGS: Three cortical screws traverse the proximal aspect of the tibia. There is no acute fracture or subluxation. No evidence for joint effusion. IMPRESSION: No evidence for acute  abnormality. Electronically Signed   By: Norva Pavlov M.D.   On: 03/03/2021 13:40     Scheduled Meds: . apixaban  10 mg Oral BID   Followed by  . [START ON 03/09/2021] apixaban  5 mg Oral BID  . senna  1  tablet Oral QHS   Continuous Infusions: . sodium chloride 100 mL/hr at 03/03/21 0650  . azithromycin Stopped (03/02/21 1919)  . cefTRIAXone (ROCEPHIN)  IV Stopped (03/02/21 2134)  . sodium chloride       LOS: 2 days     Darlin Priestly, MD [facility] If 7PM-7AM, please contact night-coverage 03/03/2021, 4:14 PM

## 2021-03-03 NOTE — Progress Notes (Signed)
Orthopedic evaluation:  The patient was seen today to evaluate his knee.  The surgical bandage was removed.  The honeycomb dressing was left intact.  The staples look dry with no drainage and no erythema.  They can be removed with application of a new dry bandage.  The knee immobilizer needs to stay on with nonweightbearing on the left.  Dedra Skeens PA-C

## 2021-03-04 LAB — BASIC METABOLIC PANEL
Anion gap: 9 (ref 5–15)
BUN: 9 mg/dL (ref 6–20)
CO2: 24 mmol/L (ref 22–32)
Calcium: 8.8 mg/dL — ABNORMAL LOW (ref 8.9–10.3)
Chloride: 103 mmol/L (ref 98–111)
Creatinine, Ser: 0.67 mg/dL (ref 0.61–1.24)
GFR, Estimated: >60 mL/min (ref >60)
Glucose, Bld: 103 mg/dL — ABNORMAL HIGH (ref 70–99)
Potassium: 4 mmol/L (ref 3.5–5.1)
Sodium: 136 mmol/L (ref 135–145)

## 2021-03-04 LAB — CBC
HCT: 30.2 % — ABNORMAL LOW (ref 39.0–52.0)
Hemoglobin: 9.8 g/dL — ABNORMAL LOW (ref 13.0–17.0)
MCH: 26.6 pg (ref 26.0–34.0)
MCHC: 32.5 g/dL (ref 30.0–36.0)
MCV: 82.1 fL (ref 80.0–100.0)
Platelets: 319 10*3/uL (ref 150–400)
RBC: 3.68 MIL/uL — ABNORMAL LOW (ref 4.22–5.81)
RDW: 13.4 % (ref 11.5–15.5)
WBC: 12.9 10*3/uL — ABNORMAL HIGH (ref 4.0–10.5)
nRBC: 0 % (ref 0.0–0.2)

## 2021-03-04 LAB — MAGNESIUM: Magnesium: 1.7 mg/dL (ref 1.7–2.4)

## 2021-03-04 LAB — GLUCOSE, CAPILLARY
Glucose-Capillary: 100 mg/dL — ABNORMAL HIGH (ref 70–99)
Glucose-Capillary: 106 mg/dL — ABNORMAL HIGH (ref 70–99)

## 2021-03-04 NOTE — Progress Notes (Signed)
Pt vital signs done hr 110, Tem 100.7 yellow mews fired. Tylenol given, sponge bath done and room temp adjusted. Observation continues.

## 2021-03-04 NOTE — Progress Notes (Signed)
03/02/21 1942 03/02/21 1945 03/02/21 2000  Assess: MEWS Score  Temp (!) 103 F (39.4 C)  --   --   BP 118/64  --   --   Pulse Rate (!) 121  --   --   Resp 20  --   --   Level of Consciousness  --   --  Alert  SpO2 98 %  --   --   O2 Device Nasal Cannula  --   --   Assess: MEWS Score  MEWS Temp 2  --  2  MEWS Systolic 0  --  0  MEWS Pulse 2  --  2  MEWS RR 0  --  0  MEWS LOC 0  --  0  MEWS Score 4  --  4  MEWS Score Color Red  --  Red  Assess: if the MEWS score is Yellow or Red  Were vital signs taken at a resting state? Yes  --   --   Focused Assessment Change from prior assessment (see assessment flowsheet)  --   --   Early Detection of Sepsis Score *See Row Information* Low  --   --   MEWS guidelines implemented *See Row Information* Yes  --   --   Treat  MEWS Interventions Administered prn meds/treatments;Escalated (See documentation below);Consulted Respiratory Therapy  --   --   Pain Scale 0-10 0-10  --   Pain Score 8 8  --   Pain Type Acute pain Acute pain  --   Pain Location Chest Chest  --   Pain Orientation Mid Mid  --   Pain Descriptors / Indicators Aching Aching  --   Pain Frequency Intermittent Intermittent  --   Pain Onset On-going On-going  --   Pain Intervention(s) Medication (See eMAR)  --   --   Multiple Pain Sites No  --   --   Complains of  (chest discomfort)  --   --   Patients response to intervention Decreased  --   --   Take Vital Signs  Increase Vital Sign Frequency  Red: Q 1hr X 4 then Q 4hr X 4, if remains red, continue Q 4hrs  --   --   Escalate  MEWS: Escalate Red: discuss with charge nurse/RN and provider, consider discussing with RRT  --   --   Notify: Charge Nurse/RN  Name of Charge Nurse/RN Notified De bra, RN  --   --   Date Charge Nurse/RN Notified 03/02/21  --   --   Time Charge Nurse/RN Notified 1945  --   --   Notify: Provider  Provider Name/Title  --   --   --   Date Provider Notified  --   --   --   Time Provider Notified  --    --   --   Notification Type  --   --   --   Notification Reason  --   --   --   Provider response  --   --   --   Date of Provider Response  --   --   --   Time of Provider Response  --   --   --   Notify: Rapid Response  Name of Rapid Response RN Notified  --   --   --   Date Rapid Response Notified  --   --   --   Time Rapid Response Notified  --   --   --  Document  Patient Outcome Other (Comment);Stabilized after interventions  --   --   Progress note created (see row info) Yes  --   --     03/02/21 2011  Assess: MEWS Score  Temp  --   BP  --   Pulse Rate  --   Resp  --   Level of Consciousness Alert  SpO2  --   O2 Device Nasal Cannula  Assess: MEWS Score  MEWS Temp 2  MEWS Systolic 0  MEWS Pulse 2  MEWS RR 0  MEWS LOC 0  MEWS Score 4  MEWS Score Color Red  Assess: if the MEWS score is Yellow or Red  Were vital signs taken at a resting state?  --   Focused Assessment  --   Early Detection of Sepsis Score *See Row Information*  --   MEWS guidelines implemented *See Row Information*  --   Treat  MEWS Interventions  --   Pain Scale  --   Pain Score  --   Pain Type  --   Pain Location  --   Pain Orientation  --   Pain Descriptors / Indicators  --   Pain Frequency  --   Pain Onset  --   Pain Intervention(s)  --   Multiple Pain Sites  --   Complains of  --   Patients response to intervention  --   Take Vital Signs  Increase Vital Sign Frequency   --   Escalate  MEWS: Escalate  --   Notify: Charge Nurse/RN  Name of Charge Nurse/RN Notified  --   Date Charge Nurse/RN Notified  --   Time Charge Nurse/RN Notified  --   Notify: Provider  Provider Name/Title Dr. Lindajo Royal  Date Provider Notified 03/02/21  Time Provider Notified 2011  Notification Type Page  Notification Reason  (red mews)  Provider response See new orders  Date of Provider Response 03/02/21  Time of Provider Response 2014  Notify: Rapid Response  Name of Rapid Response RN Notified  Estranero  Date Rapid Response Notified 03/02/21  Time Rapid Response Notified 2000  Document  Patient Outcome  --   Progress note created (see row info)  --   Inserted for Jeral Pinch RN

## 2021-03-04 NOTE — Progress Notes (Signed)
PROGRESS NOTE    ZACKERIE SARA  DPO:[id number] DOB: 13-Jun-2001 DOA: 03/01/2021 PCP: [facility]  133A/133A-AA   Assessment & Plan:   Principal Problem:   CAP (community acquired pneumonia) Active Problems:   DVT (deep venous thrombosis) (HCC)   Diabetes mellitus without complication (HCC)   Morbid obesity (HCC)   Elevated liver enzymes   TAYQUAN GASSMAN is a 20 y.o. male with medical history significant for morbid obesity (BMI 57), diet-controlled diabetes mellitus, transaminitis, COVID-19 viral infection (04/22 treated with Paxlovid for 5 days), hypertension, status post recent left knee arthroscopy and chondroplasty with loose body removal, MPFL reconstruction using allograft and tibial tubercle osteotomy who presents to the emergency room for evaluation of pain mostly in the left intercostal area.  Pain is pleuritic and worse with inspiration associated with shortness of breath.     Sepsis 2/2 Community-acquired pneumonia Patient presents for evaluation of pleuritic chest pain and shortness of breath --tachycardia, tachypnea, with leukocytosis on presentation, later also developed a fever. --CT angiogram was negative for PE but shows bronchopneumonia involving the left lower lobe --started on Rocephin and Zithromax Plan: --cont ceftriaxone and azithro for a 5-day course  Left leg acute DVT, POA Patient is status post recent extensive orthopedic surgery involving his left lower extremity Ultrasound showed a DVT in the posterior tibial vein --started patient on heparin, transitioned to Eliquis Plan: --cont Eliquis  Transaminitis Most likely related to steatohepatitis from morbid obesity  Diabetes mellitus, ruled out --A1c 5.0.  No abnormal A1c on record. --no need for fingersticks and SSI  Morbid obesity (BMI 57) Complicates overall prognosis and care. --discussed body habitus interfering with lung expansion  Status post recent Left knee arthroscopy and  chondroplasty with loose body removal, MPFL reconstruction using allograft, and tibial tubercle osteotomy on 02/19/21 --with Dr. Allena Katz --wound checked and staples removed by ortho on 5/14 Plan: --cont knee immobilizer - nonweightbearing on that left leg --Norco PRN   DVT prophylaxis: [id number] Code Status: Full code  Family Communication: father updated at bedside today Level of care: Med-Surg Dispo:   The patient is from: home Anticipated d/c is to: home Anticipated d/c date is: 1-2 days Patient currently is not medically ready to d/c due to: on IV abx for PNA   Subjective and Interval History:  Still having mild fevers, still complaining of left-sided chest pain with deep breathing, however, overall feeling better.  Off of O2 today.   Objective: Vitals:   03/04/21 0138 03/04/21 0524 03/04/21 0726 03/04/21 1147  BP: (!) 109/59 123/71 112/61 114/61  Pulse: 99 96 96 99  Resp: 18 20 20  (!) 22  Temp: 98.9 F (37.2 C) 99.1 F (37.3 C) 98.6 F (37 C) 99.2 F (37.3 C)  TempSrc: Oral Oral    SpO2: 94% 96% 96% 94%  Weight:      Height:        Intake/Output Summary (Last 24 hours) at 03/04/2021 1444 Last data filed at 03/04/2021 0400 Gross per 24 hour  Intake 957.68 ml  Output 500 ml  Net 457.68 ml   Filed Weights   03/01/21 0920 03/01/21 0944  Weight: (!) 215.9 kg (!) 207.5 kg    Examination:   Constitutional: NAD, AAOx3, sitting at edge of bed HEENT: conjunctivae and lids normal, EOMI CV: No cyanosis.   RESP: normal respiratory effort, on RA Extremities: left knee in brace, left foot swollen SKIN: warm, dry Neuro: II - XII grossly intact.   Psych: subdued mood and  affect.  Appropriate judgement and reason   Data Reviewed: I have personally reviewed following labs and imaging studies  CBC: Recent Labs  Lab 03/01/21 0933 03/02/21 0458 03/03/21 0430 03/04/21 0600  WBC 13.8* 14.7* 13.1* 12.9*  HGB 11.5* 11.3* 9.9* 9.8*  HCT 35.6* 34.8* 30.2* 30.2*  MCV  82.6 83.5 82.5 82.1  PLT 376 365 315 319   Basic Metabolic Panel: Recent Labs  Lab 03/01/21 0933 03/02/21 0458 03/03/21 0430 03/04/21 0600  NA 135 138 135 136  K 4.1 3.9 3.7 4.0  CL 101 104 102 103  CO2 23 23 23 24   GLUCOSE 102* 91 98 103*  BUN 14 12 11 9   CREATININE 0.78 0.81 0.71 0.67  CALCIUM 9.6 9.3 8.7* 8.8*  MG  --   --  1.8 1.7   GFR: Estimated Creatinine Clearance: 278.5 mL/min (by C-G formula based on SCr of 0.67 mg/dL). Liver Function Tests: Recent Labs  Lab 03/01/21 0933  AST 50*  ALT 76*  ALKPHOS 124  BILITOT 1.4*  PROT 7.9  ALBUMIN 3.6   No results for input(s): LIPASE, AMYLASE in the last 168 hours. No results for input(s): AMMONIA in the last 168 hours. Coagulation Profile: No results for input(s): INR, PROTIME in the last 168 hours. Cardiac Enzymes: No results for input(s): CKTOTAL, CKMB, CKMBINDEX, TROPONINI in the last 168 hours. BNP (last 3 results) No results for input(s): PROBNP in the last 8760 hours. HbA1C: Recent Labs    03/02/21 0458  HGBA1C 5.0   CBG: Recent Labs  Lab 03/03/21 1130 03/03/21 1654 03/03/21 2131 03/04/21 0729 03/04/21 1150  GLUCAP 111* 101* 93 100* 106*   Lipid Profile: No results for input(s): CHOL, HDL, LDLCALC, TRIG, CHOLHDL, LDLDIRECT in the last 72 hours. Thyroid Function Tests: No results for input(s): TSH, T4TOTAL, FREET4, T3FREE, THYROIDAB in the last 72 hours. Anemia Panel: No results for input(s): VITAMINB12, FOLATE, FERRITIN, TIBC, IRON, RETICCTPCT in the last 72 hours. Sepsis Labs: Recent Labs  Lab 03/01/21 1339 03/02/21 0458  PROCALCITON  --  <0.10  LATICACIDVEN 1.4  --     Recent Results (from the past 240 hour(s))  Blood culture (routine x 2)     Status: None (Preliminary result)   Collection Time: 03/01/21  9:59 AM   Specimen: BLOOD  Result Value Ref Range Status   Specimen Description BLOOD BLOOD LEFT FOREARM  Final   Special Requests   Final    BOTTLES DRAWN AEROBIC AND ANAEROBIC  Blood Culture results may not be optimal due to an excessive volume of blood received in culture bottles   Culture   Final    NO GROWTH 3 DAYS Performed at [facility], [address] [place], [address] [place]    Report Status PENDING  Incomplete  Blood culture (routine x 2)     Status: None (Preliminary result)   Collection Time: 03/01/21 10:27 AM   Specimen: BLOOD  Result Value Ref Range Status   Specimen Description BLOOD BLOOD LEFT HAND  Final   Special Requests   Final    BOTTLES DRAWN AEROBIC AND ANAEROBIC Blood Culture adequate volume   Culture   Final    NO GROWTH 3 DAYS Performed at [facility], [address] [place], [address] [place]    Report Status PENDING  Incomplete  Resp Panel by RT-PCR (Flu A&B, Covid) Nasopharyngeal Swab     Status: None   Collection Time: 03/01/21 11:25 AM   Specimen: Nasopharyngeal Swab; Nasopharyngeal(NP) swabs  in vial transport medium  Result Value Ref Range Status   SARS Coronavirus 2 by RT PCR NEGATIVE NEGATIVE Final    Comment: (NOTE) SARS-CoV-2 target nucleic acids are NOT DETECTED.  The SARS-CoV-2 RNA is generally detectable in upper respiratory specimens during the acute phase of infection. The lowest concentration of SARS-CoV-2 viral copies this assay can detect is 138 copies/mL. A negative result does not preclude SARS-Cov-2 infection and should not be used as the sole basis for treatment or other patient management decisions. A negative result may occur with  improper specimen collection/handling, submission of specimen other than nasopharyngeal swab, presence of viral mutation(s) within the areas targeted by this assay, and inadequate number of viral copies(<138 copies/mL). A negative result must be combined with clinical observations, patient history, and epidemiological information. The expected result is Negative.  Fact Sheet for Patients:  BloggerCourse.com  Fact Sheet for  Healthcare Providers:  SeriousBroker.it  This test is no t yet approved or cleared by the [place] FDA and  has been authorized for detection and/or diagnosis of SARS-CoV-2 by FDA under an Emergency Use Authorization (EUA). This EUA will remain  in effect (meaning this test can be used) for the duration of the COVID-19 declaration under Section 564(b)(1) of the Act, 21 U.S.C.section 360bbb-3(b)(1), unless the authorization is terminated  or revoked sooner.       Influenza A by PCR NEGATIVE NEGATIVE Final   Influenza B by PCR NEGATIVE NEGATIVE Final    Comment: (NOTE) The Xpert Xpress SARS-CoV-2/FLU/RSV plus assay is intended as an aid in the diagnosis of influenza from Nasopharyngeal swab specimens and should not be used as a sole basis for treatment. Nasal washings and aspirates are unacceptable for Xpert Xpress SARS-CoV-2/FLU/RSV testing.  Fact Sheet for Patients: BloggerCourse.com  Fact Sheet for Healthcare Providers: SeriousBroker.it  This test is not yet approved or cleared by the [place] FDA and has been authorized for detection and/or diagnosis of SARS-CoV-2 by FDA under an Emergency Use Authorization (EUA). This EUA will remain in effect (meaning this test can be used) for the duration of the COVID-19 declaration under Section 564(b)(1) of the Act, 21 U.S.C. section 360bbb-3(b)(1), unless the authorization is terminated or revoked.  Performed at [facility], [address] [place], [place] [address]       Radiology Studies: DG Knee Left Port  Result Date: 03/03/2021 CLINICAL DATA:  History of arthroscopy and surgery of the LEFT knee. EXAM: PORTABLE LEFT KNEE - 1-2 VIEW COMPARISON:  02/19/2021 FINDINGS: Three cortical screws traverse the proximal aspect of the tibia. There is no acute fracture or subluxation. No evidence for joint effusion. IMPRESSION: No evidence for  acute  abnormality. Electronically Signed   By: Norva Pavlov M.D.   On: 03/03/2021 13:40     Scheduled Meds: . apixaban  10 mg Oral BID   Followed by  . [START ON 03/09/2021] apixaban  5 mg Oral BID  . senna  1 tablet Oral QHS   Continuous Infusions: . azithromycin 500 mg (03/03/21 1751)  . cefTRIAXone (ROCEPHIN)  IV 2 g (03/03/21 2113)  . sodium chloride       LOS: 3 days     Darlin Priestly, MD [facility] If 7PM-7AM, please contact night-coverage 03/04/2021, 2:44 PM

## 2021-03-05 LAB — CBC
HCT: 29.1 % — ABNORMAL LOW (ref 39.0–52.0)
Hemoglobin: 9.3 g/dL — ABNORMAL LOW (ref 13.0–17.0)
MCH: 26.5 pg (ref 26.0–34.0)
MCHC: 32 g/dL (ref 30.0–36.0)
MCV: 82.9 fL (ref 80.0–100.0)
Platelets: 331 10*3/uL (ref 150–400)
RBC: 3.51 MIL/uL — ABNORMAL LOW (ref 4.22–5.81)
RDW: 13.7 % (ref 11.5–15.5)
WBC: 10.4 10*3/uL (ref 4.0–10.5)
nRBC: 0 % (ref 0.0–0.2)

## 2021-03-05 LAB — BASIC METABOLIC PANEL
Anion gap: 9 (ref 5–15)
BUN: 11 mg/dL (ref 6–20)
CO2: 24 mmol/L (ref 22–32)
Calcium: 8.9 mg/dL (ref 8.9–10.3)
Chloride: 103 mmol/L (ref 98–111)
Creatinine, Ser: 0.6 mg/dL — ABNORMAL LOW (ref 0.61–1.24)
GFR, Estimated: >60 mL/min (ref >60)
Glucose, Bld: 96 mg/dL (ref 70–99)
Potassium: 3.9 mmol/L (ref 3.5–5.1)
Sodium: 136 mmol/L (ref 135–145)

## 2021-03-05 LAB — PROCALCITONIN: Procalcitonin: <0.1 ng/mL

## 2021-03-05 LAB — MAGNESIUM: Magnesium: 1.8 mg/dL (ref 1.7–2.4)

## 2021-03-05 MED ORDER — SODIUM CHLORIDE 0.9 % IV SOLN
2.0000 g | INTRAVENOUS | Status: AC
  Administered 2021-03-05: 2 g via INTRAVENOUS
  Filled 2021-03-05: qty 20

## 2021-03-05 MED ORDER — HYDROCODONE-ACETAMINOPHEN 5-325 MG PO TABS
1.0000 | ORAL_TABLET | Freq: Four times a day (QID) | ORAL | Status: DC | PRN
  Administered 2021-03-05: 2 via ORAL
  Filled 2021-03-05: qty 2

## 2021-03-05 MED ORDER — POLYETHYLENE GLYCOL 3350 17 G PO PACK: 34.0000 g | PACK | Freq: Two times a day (BID) | ORAL | 0 refills | Status: DC | PRN

## 2021-03-05 MED ORDER — HYDROCODONE-ACETAMINOPHEN 5-325 MG PO TABS
1.0000 | ORAL_TABLET | Freq: Four times a day (QID) | ORAL | Status: DC | PRN
Start: 2021-03-05 — End: 2021-03-05

## 2021-03-05 MED ORDER — APIXABAN (ELIQUIS) VTE STARTER PACK (10MG AND 5MG): ORAL_TABLET | ORAL | 0 refills | Status: DC

## 2021-03-05 MED ORDER — POLYETHYLENE GLYCOL 3350 17 G PO PACK
34.0000 g | PACK | Freq: Two times a day (BID) | ORAL | Status: DC
  Administered 2021-03-05: 34 g via ORAL

## 2021-03-05 NOTE — Plan of Care (Signed)
  Problem: Education: Goal: Knowledge of General Education information will improve Description: Including pain rating scale, medication(s)/side effects and non-pharmacologic comfort measures Outcome: Progressing   Problem: Clinical Measurements: Goal: Ability to maintain clinical measurements within normal limits will improve Outcome: Progressing   Problem: Activity: Goal: Risk for activity intolerance will decrease Outcome: Progressing   Problem: Coping: Goal: Level of anxiety will decrease Outcome: Progressing   Problem: Elimination: Goal: Will not experience complications related to bowel motility Outcome: Progressing   Problem: Pain Managment: Goal: General experience of comfort will improve Outcome: Progressing   Problem: Safety: Goal: Ability to remain free from injury will improve Outcome: Progressing   

## 2021-03-05 NOTE — Progress Notes (Addendum)
Yellow mews fired at the start of the shift PRN drugs given  vital signs stable at present. Wound care done to left knee, cleansed with normal saline, island dressing to midline wound and gauze applied to right lateral wound secured with tegaderm. Tolerated procedure. Knee brace back in place.

## 2021-03-05 NOTE — Progress Notes (Signed)
PT Cancellation Note  Patient Details Name: Todd Ward MRN: [id number] DOB: 01-15-2001   Cancelled Treatment:    Reason Eval/Treat Not Completed: Other (comment): Per patient and patient's family pt now scheduled to discharge home today and no PT services desired.  Will complete PT orders at this time but will reassess pt pending a change in status upon receipt of new PT orders.    Ovidio Hanger PT, DPT 03/05/21, 3:06 PM

## 2021-03-05 NOTE — Discharge Summary (Signed)
Physician Discharge Summary   Todd Ward  male DOB: [birth year]  ZOX:[id number]   PCP: [facility]  Admit date: 03/01/2021 Discharge date: 03/05/2021  Admitted From: home Disposition:  Home Father updated prior to discharge.  CODE STATUS: Full code  Discharge Instructions    Discharge instructions   Complete by: As directed    You have received 5 days of IV antibiotics for your pneumonia.  You are still having mild fevers which may be due to the blood clots in your leg.  Please take Eliquis for your blood clots.  You will need your primary care doctor to extend the prescription to take for 3-6 months.   Dr. Darlin Priestly - -   No wound care   Complete by: As directed        Hospital Course:  For full details, please see H&P, progress notes, consult notes and ancillary notes.  Briefly,  Todd Ward a 20 y.o.malewith medical history significant formorbid obesity(BMI 57),diet-controlled diabetes mellitus, transaminitis,COVID-19 viral infection(04/22treated with Paxlovidfor 5 days),hypertension, status post recent left knee arthroscopy and chondroplasty with loose body removal, MPFL reconstruction using allograft and tibial tubercle osteotomy who presents to the emergency room for evaluation of pain mostly in the left intercostal area. Pain is pleuritic and worse with inspiration associated with shortness of breath.    Sepsis 2/2 Community-acquired pneumonia --tachycardia, tachypnea, with leukocytosis on presentation, later also developed a fever. --CT angiogram was negative for PE but shows bronchopneumonia involving the left lower lobe --started on Rocephin and Zithromax and complete 5-day course prior to discharge.  Respiratory symptoms improved.  Pt was still having mild fevers prior to discharge, which could be due to his DVT.  Pt was ready and asked to be discharged.    Likely OSA Pt was noted to desat during sleep.  Given body habitus, likely  has OSA.  Pt and father were advised to follow up with PCP for sleep study.  While awake, O2 sat >92%, and did not need or qualify for supplemental O2 for home use.  Left leg acute DVT, POA Considered provoked due to recent left knee surgery.  Ultrasound showed a DVT in the posterior tibial vein --pt was started patient on heparin, and transitioned to Eliquis.  Prescribed 1 month of Eliquis with coupon provided at discharge.  Will need 3-6 months of treatment, pt and family advised to follow up with PCP to continue prescription.  Transaminitis Most likely related to steatohepatitis from morbid obesity  Diabetes mellitus, ruled out --A1c 5.0.  No abnormal A1c on record.  Morbid obesity(BMI 57) Complicates overall prognosis and care. --discussed body habitus interfering with lung expansion  Status post recentLeft knee arthroscopy and chondroplasty with loose body removal, MPFL reconstruction using allograft, and tibial tubercle osteotomy on 02/19/21 --with Dr. Allena Katz --wound checked and staples removed by ortho on 5/14. --cont knee immobilizer - nonweightbearing on that left leg --follow up with ortho as outpatient.   Discharge Diagnoses:  Principal Problem:   CAP (community acquired pneumonia) Active Problems:   DVT (deep venous thrombosis) (HCC)   Diabetes mellitus without complication (HCC)   Morbid obesity (HCC)   Elevated liver enzymes   30 Day Unplanned Readmission Risk Score   Flowsheet Row ED to Hosp-Admission (Current) from 03/01/2021 in [facility] (1A)  30 Day Unplanned Readmission Risk Score (%) 9.95 Filed at 03/05/2021 0801     This score is the patient's risk of an unplanned readmission within 30  days of being discharged (0 -100%). The score is based on dignosis, age, lab data, medications, orders, and past utilization.   Low:  0-14.9   Medium: 15-21.9   High: 22-29.9   Extreme: 30 and above        Discharge  Instructions:  Allergies as of 03/05/2021   No Known Allergies     Medication List    STOP taking these medications   aspirin 325 MG EC tablet     TAKE these medications   albuterol 108 (90 Base) MCG/ACT inhaler Commonly known as: VENTOLIN HFA Inhale 2 puffs into the lungs every 6 (six) hours as needed for wheezing or shortness of breath.   Apixaban Starter Pack (  and ) Commonly known as: ELIQUIS STARTER PACK Take as directed on package: start with two-5mg  tablets twice daily for 7 days. On day 8, switch to one-5mg  tablet twice daily.   methocarbamol 500 MG tablet Commonly known as: ROBAXIN Take 1 tablet (500 mg total) by mouth every 6 (six) hours as needed for up to 20 doses for muscle spasms.   multivitamin with minerals tablet Take 1 tablet by mouth daily.   ondansetron 4 MG tablet Commonly known as: ZOFRAN Take 1 tablet (4 mg total) by mouth every 6 (six) hours as needed for nausea.   oxyCODONE 5 MG immediate release tablet Commonly known as: Oxy IR/ROXICODONE Take 1-2 tablets (5-10 mg total) by mouth every 4 (four) hours as needed for moderate pain (pain score 4-6).   polyethylene glycol 17 g packet Commonly known as: MIRALAX / GLYCOLAX Take 34 g by mouth 2 (two) times daily as needed for moderate constipation.   traMADol 50 MG tablet Commonly known as: ULTRAM Take 1 tablet (50 mg total) by mouth every 6 (six) hours as needed for moderate pain.        Follow-up Information    [facility] Schedule an appointment as soon as possible for a visit in 1 week(s).   Contact information: [address] [place] [address] [phone]               No Known Allergies   The results of significant diagnostics from this hospitalization (including imaging, microbiology, ancillary and laboratory) are listed below for reference.   Consultations:   Procedures/Studies: DG Knee 1-2 Views Left  Result Date: 02/19/2021 CLINICAL DATA:   Left knee reconstruction OR. EXAM: DG C-ARM 1-60 MIN; LEFT KNEE - 1-2 VIEW FLUOROSCOPY TIME:  Fluoroscopy Time:  1 minutes and 12 seconds. Radiation Exposure Index (if provided by the fluoroscopic device): 11.7 mGy. Number of Acquired Spot Images: 4. COMPARISON:  January 03, 2021. FINDINGS: Four C-arm fluoroscopic images were obtained intraoperatively and submitted for post operative interpretation. These images demonstrate surgical changes associated with arthroscopy for reported chondroplasty with loose body removal and MPFL reconstruction. Screws in the tibia likely related to reported tibial osteotomy. Please see the performing provider's procedural report for further detail. IMPRESSION: Intraoperative fluoroscopy, as detailed above. Electronically Signed   By: Feliberto Harts MD   On: 02/19/2021 12:32   CT Angio Chest PE W and/or Wo Contrast  Result Date: 03/01/2021 CLINICAL DATA:  High probability for pulmonary embolism EXAM: CT ANGIOGRAPHY CHEST WITH CONTRAST TECHNIQUE: Multidetector CT imaging of the chest was performed using the standard protocol during bolus administration of intravenous contrast. Multiplanar CT image reconstructions and MIPs were obtained to evaluate the vascular anatomy. CONTRAST:  OMNIPAQUE IOHEXOL 350 MG/ML SOLN COMPARISON:  None. FINDINGS: Cardiovascular:  Satisfactory opacification of the pulmonary arteries to the proximal segmental level. Advanced respiratory motion artifact limits evaluation of the more distal pulmonary arterial tree. No evidence of pulmonary embolism. Normal heart size. No pericardial effusion. Mediastinum/Nodes: No enlarged mediastinal, hilar, or axillary lymph nodes. Thyroid gland, trachea, and esophagus demonstrate no significant findings. Lungs/Pleura: Marked respiratory motion artifact. Dependent atelectasis present bilaterally. More focal airspace opacity present within the medial basilar segment of the left lower lobe. Findings are concerning for  bronchopneumonia. Upper Abdomen: No acute abnormality. Musculoskeletal: No chest wall abnormality. No acute or significant osseous findings. Review of the MIP images confirms the above findings. IMPRESSION: 1. No evidence of acute pulmonary embolus to the proximal segmental level. Marked respiratory motion artifact limits evaluation of the more peripheral pulmonary arterial tree. 2. Focal patchy airspace opacity in the medial basal segment of the left lower lobe concerning for bronchopneumonia. 3. Dependent atelectasis bilaterally. Electronically Signed   By: Malachy Moan M.D.   On: 03/01/2021 12:12   US Venous Img Lower Unilateral Left  Result Date: 03/01/2021 CLINICAL DATA:  20 year old male with left lower extremity pain EXAM: LEFT LOWER EXTREMITY VENOUS DOPPLER ULTRASOUND TECHNIQUE: Gray-scale sonography with graded compression, as well as color Doppler and duplex ultrasound were performed to evaluate the lower extremity deep venous systems from the level of the common femoral vein and including the common femoral, femoral, profunda femoral, popliteal and calf veins including the posterior tibial, peroneal and gastrocnemius veins when visible. The superficial great saphenous vein was also interrogated. Spectral Doppler was utilized to evaluate flow at rest and with distal augmentation maneuvers in the common femoral, femoral and popliteal veins. COMPARISON:  None. FINDINGS: Contralateral Common Femoral Vein: Respiratory phasicity is normal and symmetric with the symptomatic side. No evidence of thrombus. Normal compressibility. Common Femoral Vein: No evidence of thrombus. Normal compressibility, respiratory phasicity and response to augmentation. Saphenofemoral Junction: No evidence of thrombus. Normal compressibility and flow on color Doppler imaging. Profunda Femoral Vein: No evidence of thrombus. Normal compressibility and flow on color Doppler imaging. Femoral Vein: No evidence of thrombus. Normal  compressibility, respiratory phasicity and response to augmentation. Popliteal Vein: No evidence of thrombus. Normal compressibility, respiratory phasicity and response to augmentation. Calf Veins: Absence of flow within at least 1 of the paired posterior tibial veins. The vessel lumen is expanded and filled with low-level internal echoes. No evidence of color flow on color Doppler. Additionally, there is poor visualization of the peroneal veins. No definite color flow on color Doppler imaging. Suspect additional calf DVT. Superficial Great Saphenous Vein: No evidence of thrombus. Normal compressibility. Venous Reflux:  None. Other Findings:  None. IMPRESSION: Positive for occlusive deep venous thrombosis involving at least 1 of the paired posterior tibial veins in the calf. Suspect additional calf vein DVT as well. No evidence of DVT extension above the calf. Electronically Signed   By: Malachy Moan M.D.   On: 03/01/2021 12:06   DG Chest Port 1 View  Result Date: 03/01/2021 CLINICAL DATA:  Intercostal pain Weakness and shortness of breath Recent left knee surgery EXAM: PORTABLE CHEST 1 VIEW COMPARISON:  None. FINDINGS: Evaluation limited due to expiratory phase of imaging. The heart appears mildly enlarged with mild pulmonary vascular congestion. Mild bibasilar strandy opacities likely a combination of atelectasis and pulmonary edema. IMPRESSION: Constellation of findings most consistent with CHF. Electronically Signed   By: Acquanetta Belling M.D.   On: 03/01/2021 10:34   DG Knee Left Port  Result Date: 03/03/2021 CLINICAL DATA:  History of arthroscopy and surgery of the LEFT knee. EXAM: PORTABLE LEFT KNEE - 1-2 VIEW COMPARISON:  02/19/2021 FINDINGS: Three cortical screws traverse the proximal aspect of the tibia. There is no acute fracture or subluxation. No evidence for joint effusion. IMPRESSION: No evidence for acute  abnormality. Electronically Signed   By: Norva PavlovElizabeth  Brown M.D.   On: 03/03/2021 13:40    DG C-Arm 1-60 Min  Result Date: 02/19/2021 CLINICAL DATA:  Left knee reconstruction OR. EXAM: DG C-ARM 1-60 MIN; LEFT KNEE - 1-2 VIEW FLUOROSCOPY TIME:  Fluoroscopy Time:  1 minutes and 12 seconds. Radiation Exposure Index (if provided by the fluoroscopic device): 11.7 mGy. Number of Acquired Spot Images: 4. COMPARISON:  January 03, 2021. FINDINGS: Four C-arm fluoroscopic images were obtained intraoperatively and submitted for post operative interpretation. These images demonstrate surgical changes associated with arthroscopy for reported chondroplasty with loose body removal and MPFL reconstruction. Screws in the tibia likely related to reported tibial osteotomy. Please see the performing provider's procedural report for further detail. IMPRESSION: Intraoperative fluoroscopy, as detailed above. Electronically Signed   By: Feliberto HartsFrederick S Jones MD   On: 02/19/2021 12:32   DG C-Arm 1-60 Min  Result Date: 02/19/2021 CLINICAL DATA:  Left knee reconstruction OR. EXAM: DG C-ARM 1-60 MIN; LEFT KNEE - 1-2 VIEW FLUOROSCOPY TIME:  Fluoroscopy Time:  1 minutes and 12 seconds. Radiation Exposure Index (if provided by the fluoroscopic device): 11.7 mGy. Number of Acquired Spot Images: 4. COMPARISON:  January 03, 2021. FINDINGS: Four C-arm fluoroscopic images were obtained intraoperatively and submitted for post operative interpretation. These images demonstrate surgical changes associated with arthroscopy for reported chondroplasty with loose body removal and MPFL reconstruction. Screws in the tibia likely related to reported tibial osteotomy. Please see the performing provider's procedural report for further detail. IMPRESSION: Intraoperative fluoroscopy, as detailed above. Electronically Signed   By: Feliberto HartsFrederick S Jones MD   On: 02/19/2021 12:32   DG C-Arm 1-60 Min  Result Date: 02/19/2021 CLINICAL DATA:  Left knee reconstruction OR. EXAM: DG C-ARM 1-60 MIN; LEFT KNEE - 1-2 VIEW FLUOROSCOPY TIME:  Fluoroscopy Time:  1  minutes and 12 seconds. Radiation Exposure Index (if provided by the fluoroscopic device): 11.7 mGy. Number of Acquired Spot Images: 4. COMPARISON:  January 03, 2021. FINDINGS: Four C-arm fluoroscopic images were obtained intraoperatively and submitted for post operative interpretation. These images demonstrate surgical changes associated with arthroscopy for reported chondroplasty with loose body removal and MPFL reconstruction. Screws in the tibia likely related to reported tibial osteotomy. Please see the performing provider's procedural report for further detail. IMPRESSION: Intraoperative fluoroscopy, as detailed above. Electronically Signed   By: Feliberto HartsFrederick S Jones MD   On: 02/19/2021 12:32      Labs: BNP (last 3 results) Recent Labs    03/01/21 2146  BNP 13.5   Basic Metabolic Panel: Recent Labs  Lab 03/01/21 0933 03/02/21 0458 03/03/21 0430 03/04/21 0600 03/05/21 0542  NA 135 138 135 136 136  K 4.1 3.9 3.7 4.0 3.9  CL 101 104 102 103 103  CO2 23 23 23 24 24   GLUCOSE 102* 91 98 103* 96  BUN 14 12 11 9 11   CREATININE 0.78 0.81 0.71 0.67 0.60*  CALCIUM 9.6 9.3 8.7* 8.8* 8.9  MG  --   --  1.8 1.7 1.8   Liver Function Tests: Recent Labs  Lab 03/01/21 0933  AST 50*  ALT 76*  ALKPHOS 124  BILITOT 1.4*  PROT 7.9  ALBUMIN 3.6   No results  for input(s): LIPASE, AMYLASE in the last 168 hours. No results for input(s): AMMONIA in the last 168 hours. CBC: Recent Labs  Lab 03/01/21 0933 03/02/21 0458 03/03/21 0430 03/04/21 0600 03/05/21 0542  WBC 13.8* 14.7* 13.1* 12.9* 10.4  HGB 11.5* 11.3* 9.9* 9.8* 9.3*  HCT 35.6* 34.8* 30.2* 30.2* 29.1*  MCV 82.6 83.5 82.5 82.1 82.9  PLT 376 365 315 319 331   Cardiac Enzymes: No results for input(s): CKTOTAL, CKMB, CKMBINDEX, TROPONINI in the last 168 hours. BNP: Invalid input(s): POCBNP CBG: Recent Labs  Lab 03/03/21 1130 03/03/21 1654 03/03/21 2131 03/04/21 0729 03/04/21 1150  GLUCAP 111* 101* 93 100* 106*    D-Dimer No results for input(s): DDIMER in the last 72 hours. Hgb A1c No results for input(s): HGBA1C in the last 72 hours. Lipid Profile No results for input(s): CHOL, HDL, LDLCALC, TRIG, CHOLHDL, LDLDIRECT in the last 72 hours. Thyroid function studies No results for input(s): TSH, T4TOTAL, T3FREE, THYROIDAB in the last 72 hours.  Invalid input(s): FREET3 Anemia work up No results for input(s): VITAMINB12, FOLATE, FERRITIN, TIBC, IRON, RETICCTPCT in the last 72 hours. Urinalysis No results found for: COLORURINE, APPEARANCEUR, LABSPEC, PHURINE, GLUCOSEU, HGBUR, BILIRUBINUR, KETONESUR, PROTEINUR, UROBILINOGEN, NITRITE, LEUKOCYTESUR Sepsis Labs Invalid input(s): PROCALCITONIN,  WBC,  LACTICIDVEN Microbiology Recent Results (from the past 240 hour(s))  Blood culture (routine x 2)     Status: None (Preliminary result)   Collection Time: 03/01/21  9:59 AM   Specimen: BLOOD  Result Value Ref Range Status   Specimen Description BLOOD BLOOD LEFT FOREARM  Final   Special Requests   Final    BOTTLES DRAWN AEROBIC AND ANAEROBIC Blood Culture results may not be optimal due to an excessive volume of blood received in culture bottles   Culture   Final    NO GROWTH 4 DAYS Performed at [facility], [address], [place], [place] [address]    Report Status PENDING  Incomplete  Blood culture (routine x 2)     Status: None (Preliminary result)   Collection Time: 03/01/21 10:27 AM   Specimen: BLOOD  Result Value Ref Range Status   Specimen Description BLOOD BLOOD LEFT HAND  Final   Special Requests   Final    BOTTLES DRAWN AEROBIC AND ANAEROBIC Blood Culture adequate volume   Culture   Final    NO GROWTH 4 DAYS Performed at [facility], [address], [place], [place] [address]    Report Status PENDING  Incomplete  Resp Panel by RT-PCR (Flu A&B, Covid) Nasopharyngeal Swab     Status: None   Collection Time: 03/01/21 11:25 AM   Specimen: Nasopharyngeal Swab;  Nasopharyngeal(NP) swabs in vial transport medium  Result Value Ref Range Status   SARS Coronavirus 2 by RT PCR NEGATIVE NEGATIVE Final    Comment: (NOTE) SARS-CoV-2 target nucleic acids are NOT DETECTED.  The SARS-CoV-2 RNA is generally detectable in upper respiratory specimens during the acute phase of infection. The lowest concentration of SARS-CoV-2 viral copies this assay can detect is 138 copies/mL. A negative result does not preclude SARS-Cov-2 infection and should not be used as the sole basis for treatment or other patient management decisions. A negative result may occur with  improper specimen collection/handling, submission of specimen other than nasopharyngeal swab, presence of viral mutation(s) within the areas targeted by this assay, and inadequate number of viral copies(<138 copies/mL). A negative result must be combined with clinical observations, patient history, and epidemiological information. The expected result is  Negative.  Fact Sheet for Patients:  BloggerCourse.com  Fact Sheet for Healthcare Providers:  SeriousBroker.it  This test is no t yet approved or cleared by the [place] FDA and  has been authorized for detection and/or diagnosis of SARS-CoV-2 by FDA under an Emergency Use Authorization (EUA). This EUA will remain  in effect (meaning this test can be used) for the duration of the COVID-19 declaration under Section 564(b)(1) of the Act, 21 U.S.C.section 360bbb-3(b)(1), unless the authorization is terminated  or revoked sooner.       Influenza A by PCR NEGATIVE NEGATIVE Final   Influenza B by PCR NEGATIVE NEGATIVE Final    Comment: (NOTE) The Xpert Xpress SARS-CoV-2/FLU/RSV plus assay is intended as an aid in the diagnosis of influenza from Nasopharyngeal swab specimens and should not be used as a sole basis for treatment. Nasal washings and aspirates are unacceptable for Xpert Xpress  SARS-CoV-2/FLU/RSV testing.  Fact Sheet for Patients: BloggerCourse.com  Fact Sheet for Healthcare Providers: SeriousBroker.it  This test is not yet approved or cleared by the [place] FDA and has been authorized for detection and/or diagnosis of SARS-CoV-2 by FDA under an Emergency Use Authorization (EUA). This EUA will remain in effect (meaning this test can be used) for the duration of the COVID-19 declaration under Section 564(b)(1) of the Act, 21 U.S.C. section 360bbb-3(b)(1), unless the authorization is terminated or revoked.  Performed at [facility], [address] [place] [address] [place], [place] [address]      Total time spend on discharging this patient, including the last patient exam, discussing the hospital stay, instructions for ongoing care as it relates to all pertinent caregivers, as well as preparing the medical discharge records, prescriptions, and/or referrals as applicable, is 45 minutes.    Darlin Priestly, MD  [facility] 03/05/2021, 12:54 PM

## 2021-03-05 NOTE — Progress Notes (Signed)
Patient is A & O and able to make needs known. Mother, Eugean Arnott, and father, Delshon Blanchfield, were present at bedside throughout discharge process. AVS reviewed with all who verbalized understanding via teach back re medications, follow up appointments, signs and symptoms to notify MD as well as limitations and restrictions. Patient will be transported via EMS stretcher to home.

## 2021-03-05 NOTE — TOC Transition Note (Addendum)
Transition of Care [facility]) - CM/SW Discharge Note   Patient Details  Name: Todd Ward MRN: [id number] Date of Birth: [birth year]  [facility] CM/SW Contact:  Caryn Section, RN Phone Number: 03/05/2021, 1:51 PM   Clinical Narrative:  TOC in to see patient and family.  Patient is discharged today.  Nurse in to get O2 sats prior to discharge 93% on room air, care team ok for discharge. PTAR, patient's usual transport company is unavailable to take patient home for the foreseeable future due to low staffing situation.  [facility] notified, specific transport needs communicated to [facility] of patient weight and 6 steps to get into house as well as ortho/non weightbearing to leg.  Awaiting [facility] transport response.  Patient and family have no further questions. TOC contact information given, TOC to follow through discharge.     Addendum:  1431:  [facility] unable to accommodate.  [facility] will transport patient.  Family notified.     Patient Goals and CMS Choice        Discharge Placement                       Discharge Plan and Services                                     Social Determinants of Health (SDOH) Interventions     Readmission Risk Interventions No flowsheet data found.

## 2021-03-05 NOTE — Progress Notes (Signed)
   03/03/21 0134  Assess: MEWS Score  Temp (!) 100.7 F (38.2 C)  BP (!) 121/56  Pulse Rate (!) 110  Resp 20  SpO2 96 %  O2 Device Nasal Cannula  O2 Flow Rate (L/min) 2 L/min  Assess: MEWS Score  MEWS Temp 1  MEWS Systolic 0  MEWS Pulse 1  MEWS RR 0  MEWS LOC 0  MEWS Score 2  MEWS Score Color Yellow  Assess: if the MEWS score is Yellow or Red  Were vital signs taken at a resting state? Yes  Focused Assessment No change from prior assessment  Early Detection of Sepsis Score *See Row Information* Low  MEWS guidelines implemented *See Row Information* Yes  Treat  MEWS Interventions Administered prn meds/treatments  Pain Scale 0-10  Pain Score 7  Pain Type Acute pain  Pain Location Chest  Pain Orientation Mid  Complains of  (cheat painon exercion)  Patients response to intervention Decreased  Take Vital Signs  Increase Vital Sign Frequency  Yellow: Q 2hr X 2 then Q 4hr X 2, if remains yellow, continue Q 4hrs  Escalate  MEWS: Escalate Yellow: discuss with charge nurse/RN and consider discussing with provider and RRT  Notify: Charge Nurse/RN  Name of Charge Nurse/RN Notified Stanton Kidney , Rn  Date Charge Nurse/RN Notified 03/03/21  Time Charge Nurse/RN Notified 0157  Document  Patient Outcome Stabilized after interventions  Inserted for [employer]

## 2021-03-05 NOTE — Plan of Care (Signed)

## 2021-03-06 LAB — CULTURE, BLOOD (ROUTINE X 2)
Culture: NO GROWTH
Culture: NO GROWTH
Special Requests: ADEQUATE

## 2021-05-23 ENCOUNTER — Other Ambulatory Visit: Payer: Self-pay | Admitting: Family Medicine

## 2021-05-23 DIAGNOSIS — I80202 Phlebitis and thrombophlebitis of unspecified deep vessels of left lower extremity: Secondary | ICD-10-CM

## 2021-08-22 ENCOUNTER — Ambulatory Visit
Admission: RE | Admit: 2021-08-22 | Discharge: 2021-08-22 | Disposition: A | Discharge status: Home / Self Care | Payer: No Typology Code available for payment source | Source: Ambulatory Visit | Attending: Family Medicine | Admitting: Family Medicine

## 2021-08-22 DIAGNOSIS — I80202 Phlebitis and thrombophlebitis of unspecified deep vessels of left lower extremity: Secondary | ICD-10-CM | POA: Insufficient documentation

## 2021-11-17 IMAGING — RF DG C-ARM 1-60 MIN
1 series · 5 of 5 positions shown · non-contrast
Comparison: January 03, 2021.

CLINICAL DATA: Left knee reconstruction OR.

EXAM:
DG C-ARM 1-60 MIN; LEFT KNEE - 1-2 VIEW
FLUOROSCOPY TIME:  Fluoroscopy Time:  1 minutes and 12 seconds.
Radiation Exposure Index (if provided by the fluoroscopic device):
11.7 mGy.
Number of Acquired Spot Images: 4.

[Series 1: run · 5 of 5 slices shown]
[im 1/5]
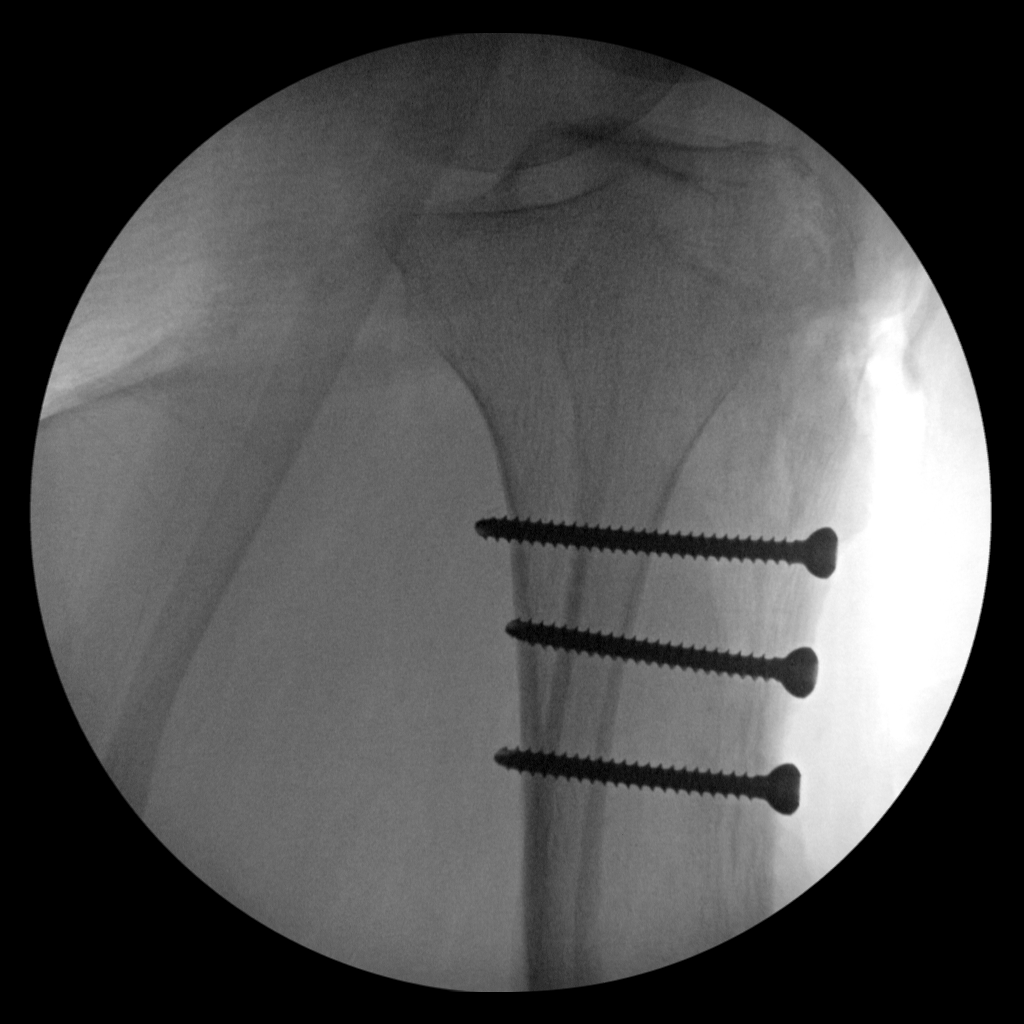
[im 2/5]
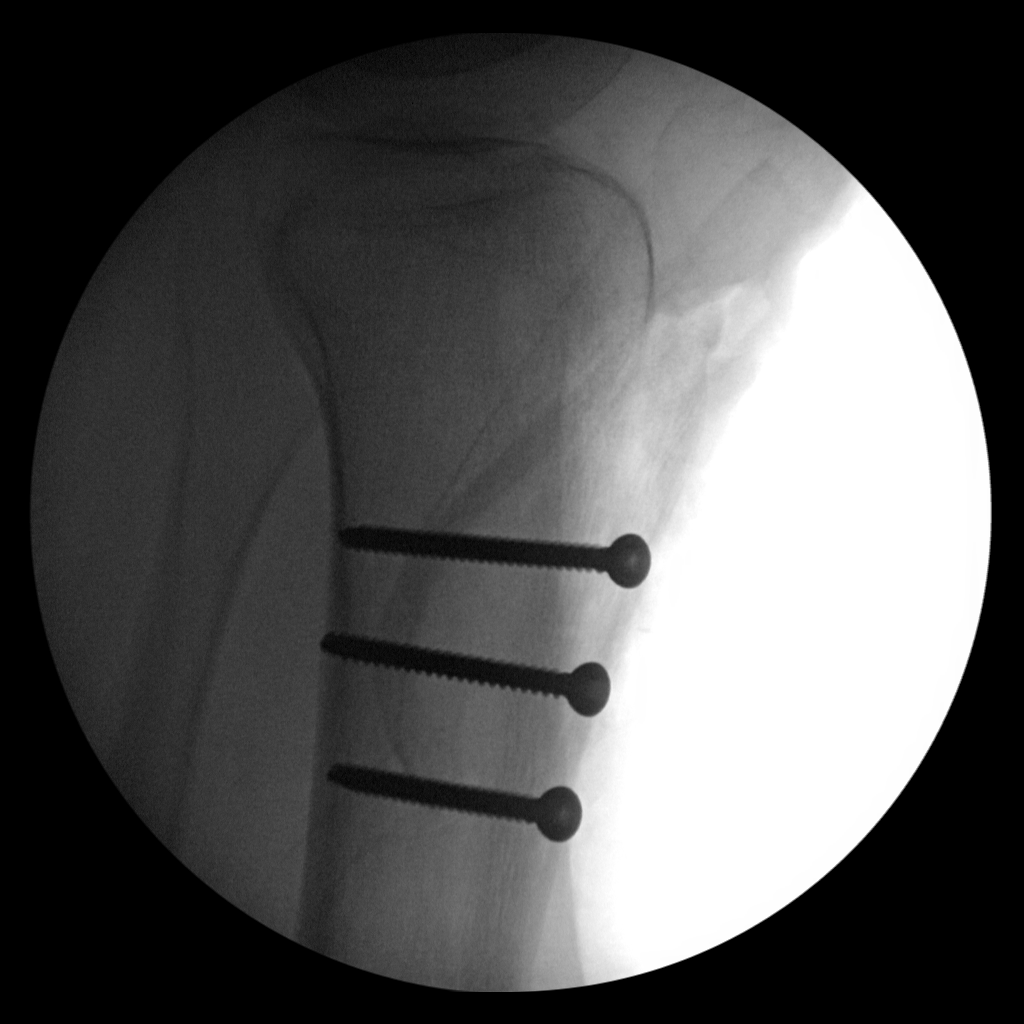
[im 3/5]
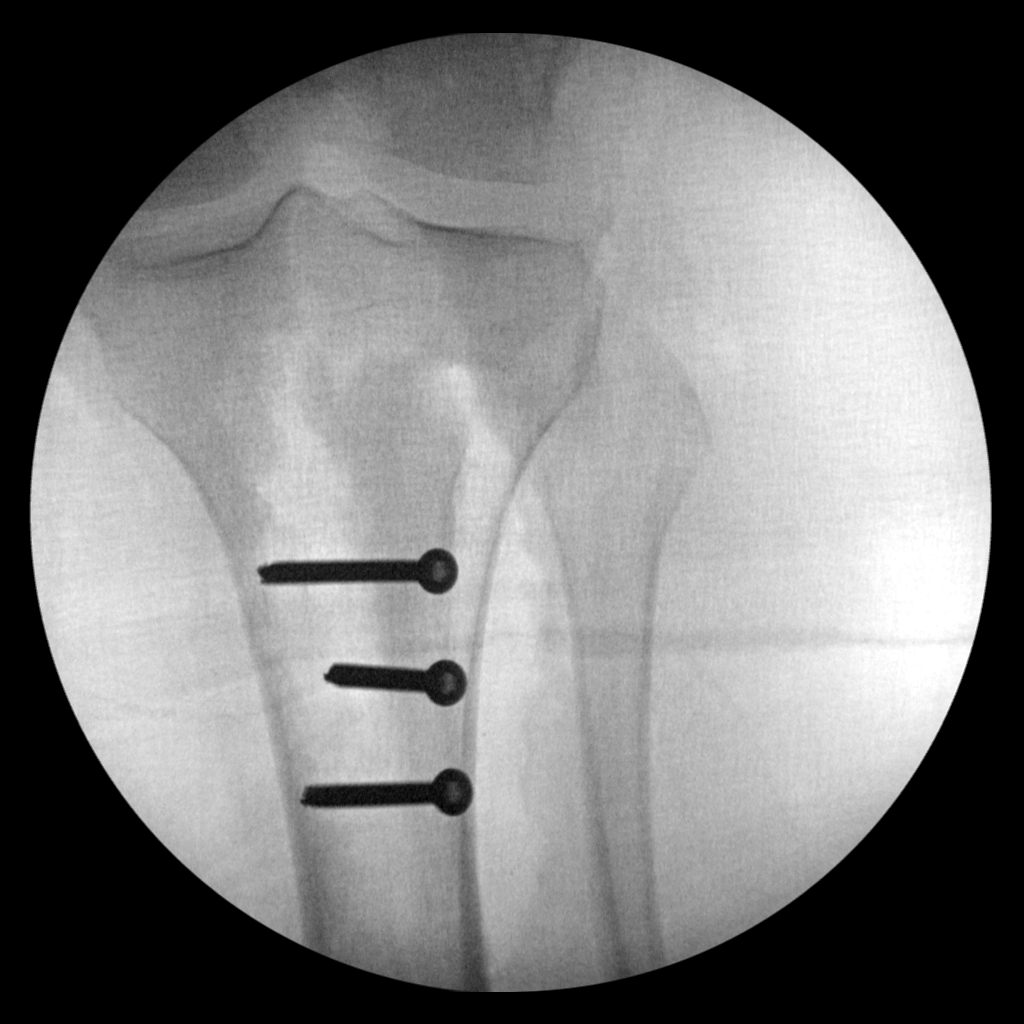
[im 4/5]
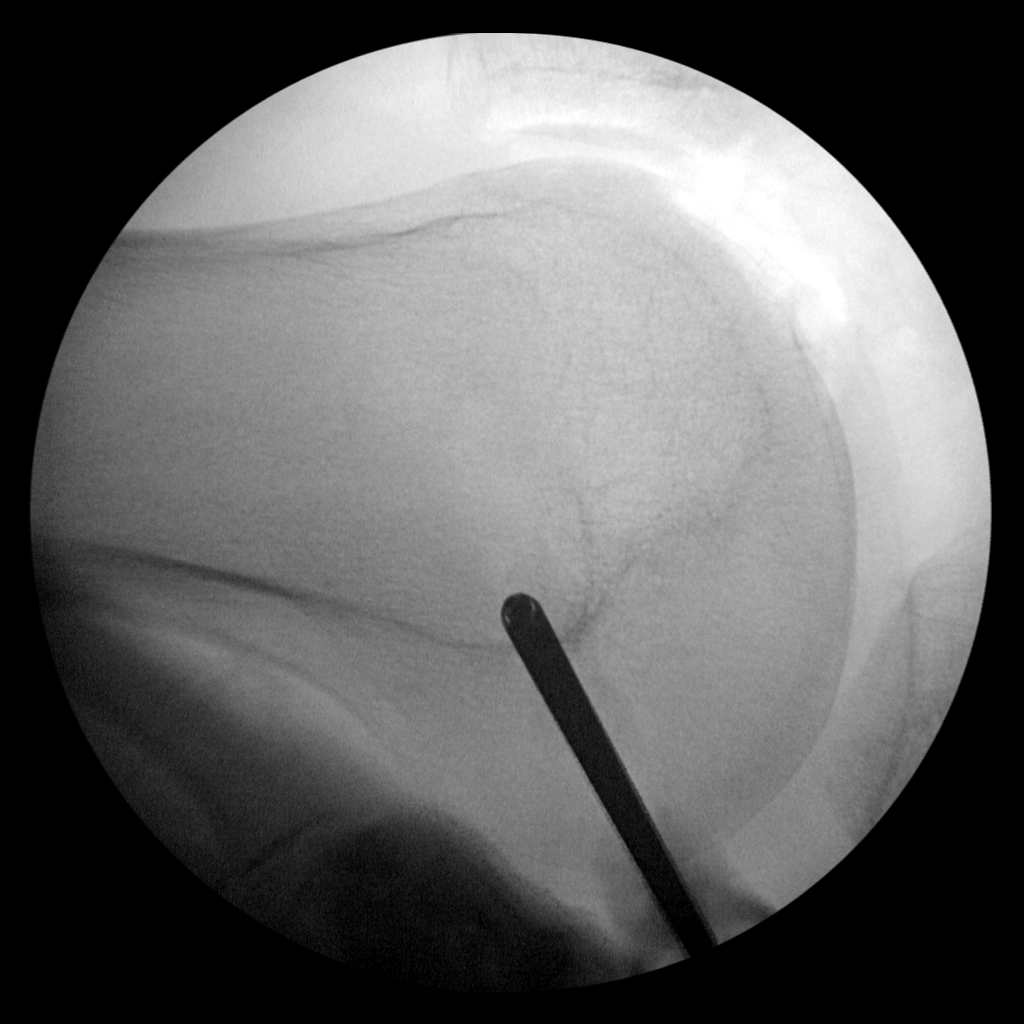
[im 5/5]
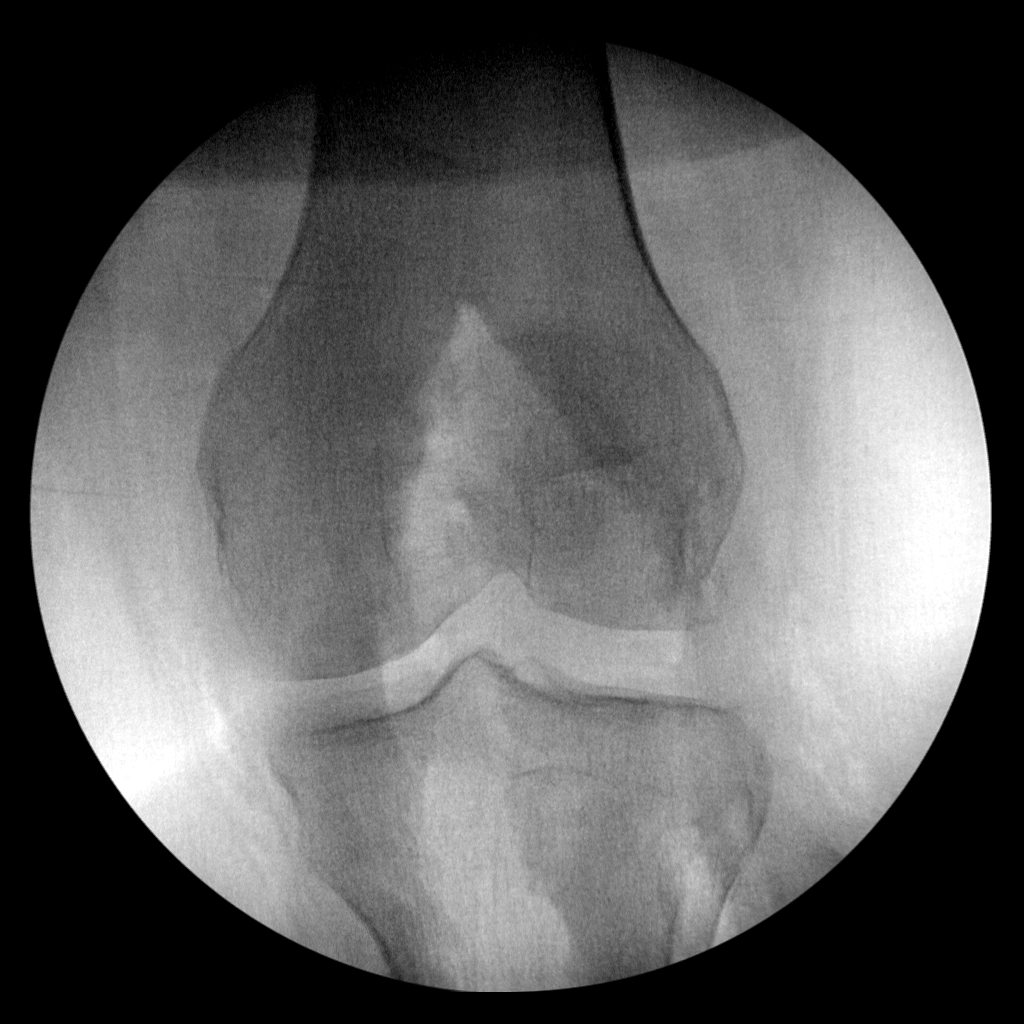

[5 of 5 positions shown; findings below may reference images not displayed]

FINDINGS: Four C-arm fluoroscopic images were obtained intraoperatively and
submitted for post operative interpretation. These images
demonstrate surgical changes associated with arthroscopy for
reported chondroplasty with loose body removal and MPFL
reconstruction. Screws in the tibia likely related to reported
tibial osteotomy. Please see the performing provider's procedural
report for further detail.
IMPRESSION: Intraoperative fluoroscopy, as detailed above.

## 2021-11-17 IMAGING — RF DG C-ARM 1-60 MIN
1 series · 5 of 5 positions shown · non-contrast
Comparison: January 03, 2021.

CLINICAL DATA: Left knee reconstruction OR.

EXAM:
DG C-ARM 1-60 MIN; LEFT KNEE - 1-2 VIEW
FLUOROSCOPY TIME:  Fluoroscopy Time:  1 minutes and 12 seconds.
Radiation Exposure Index (if provided by the fluoroscopic device):
11.7 mGy.
Number of Acquired Spot Images: 4.

[Series 1: run · 5 of 5 slices shown]
[im 1/5]
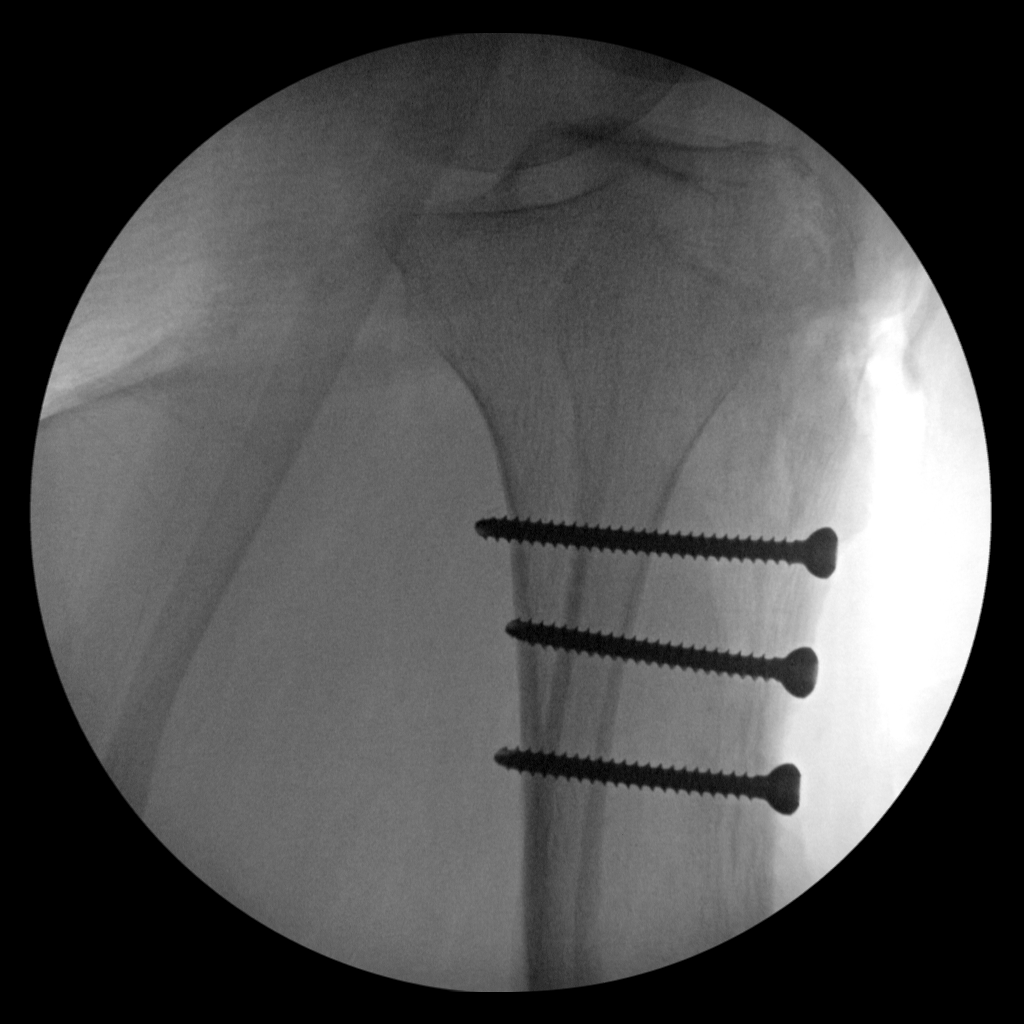
[im 2/5]
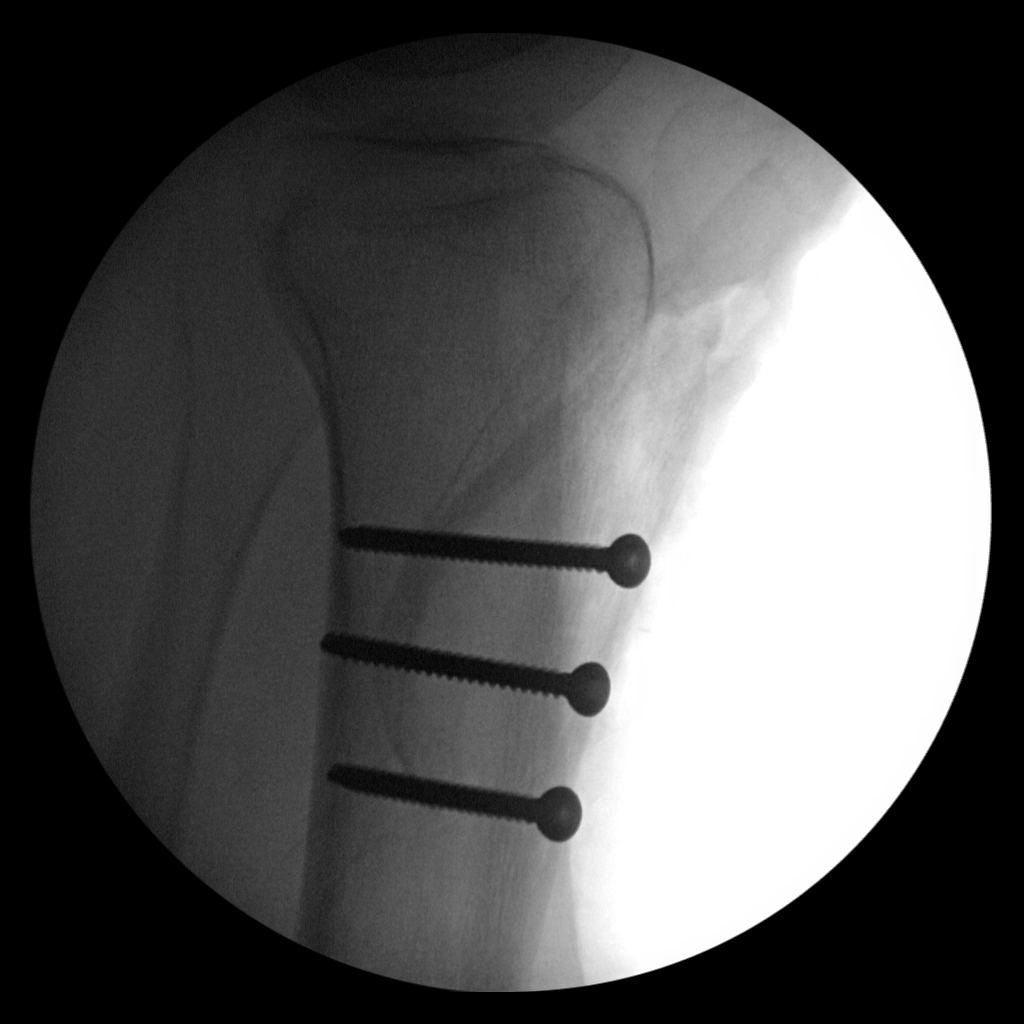
[im 3/5]
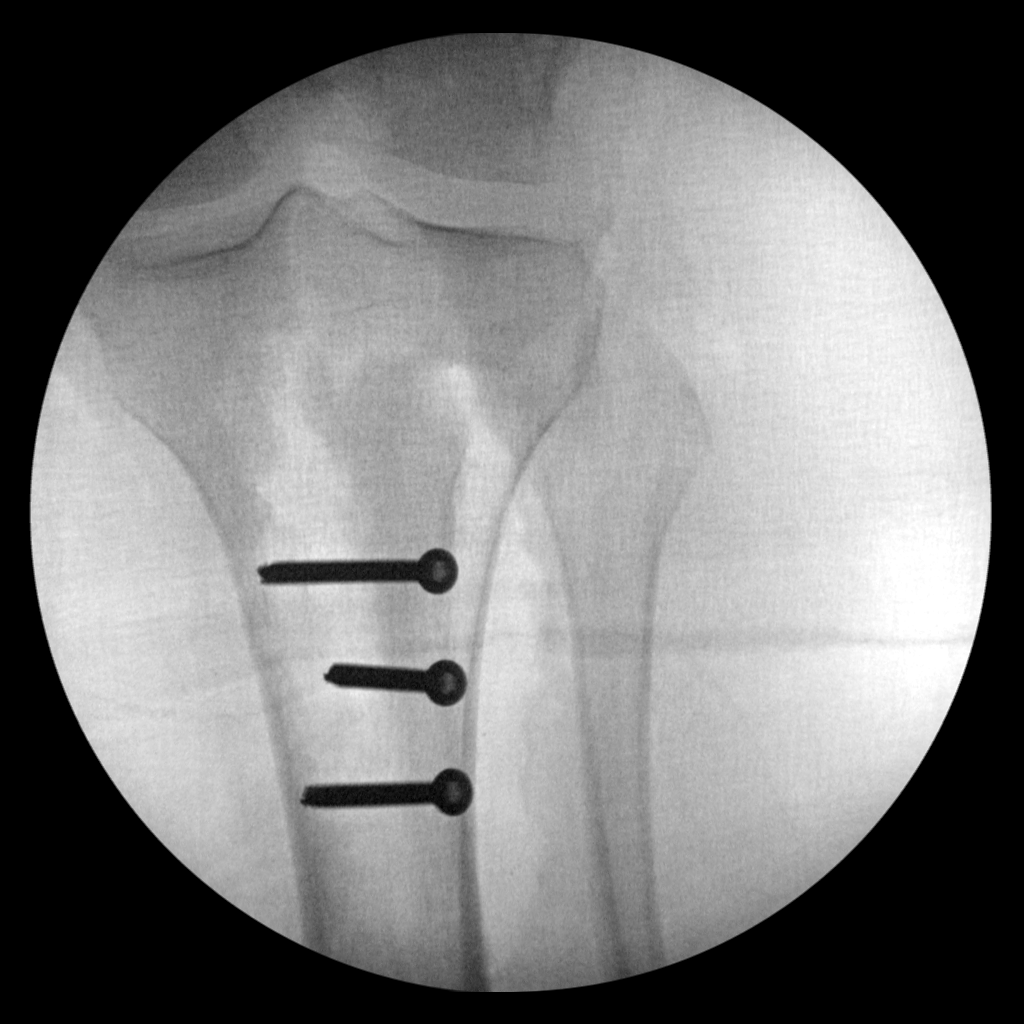
[im 4/5]
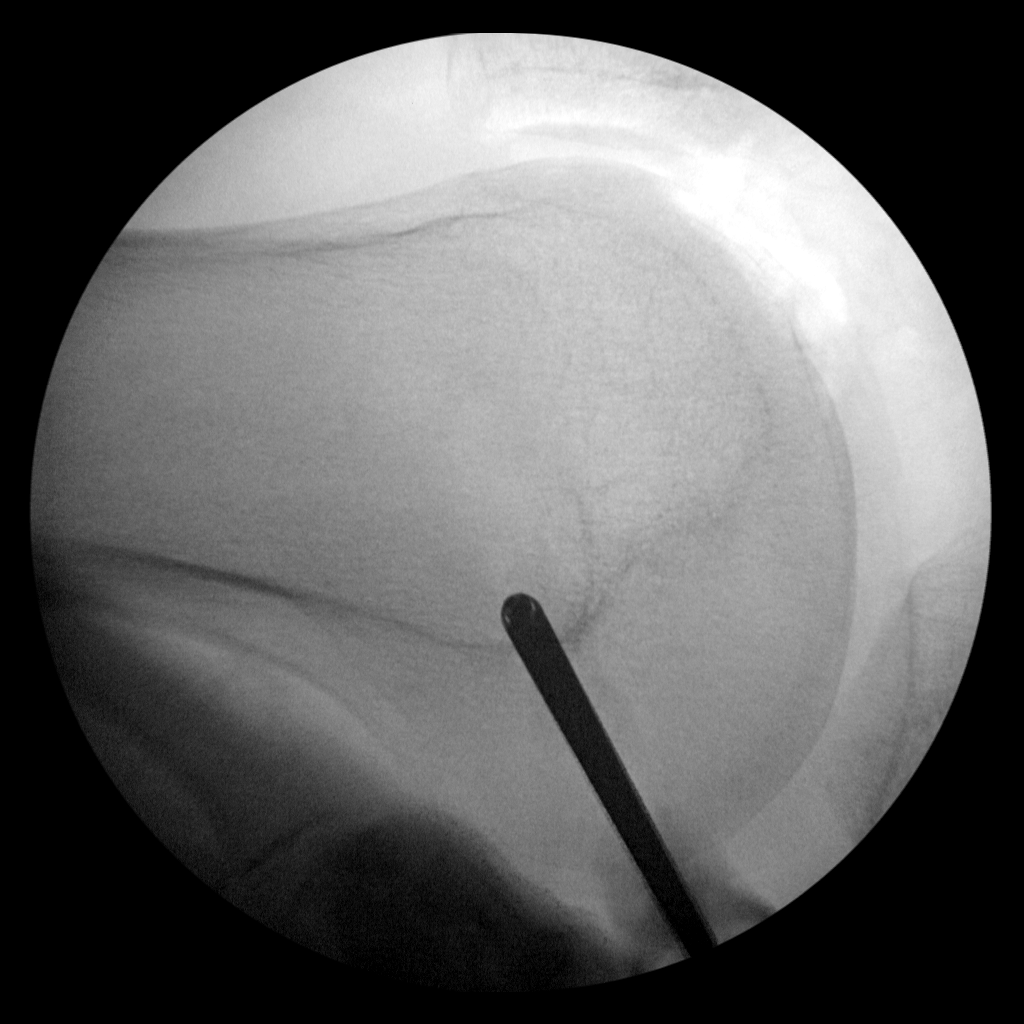
[im 5/5]
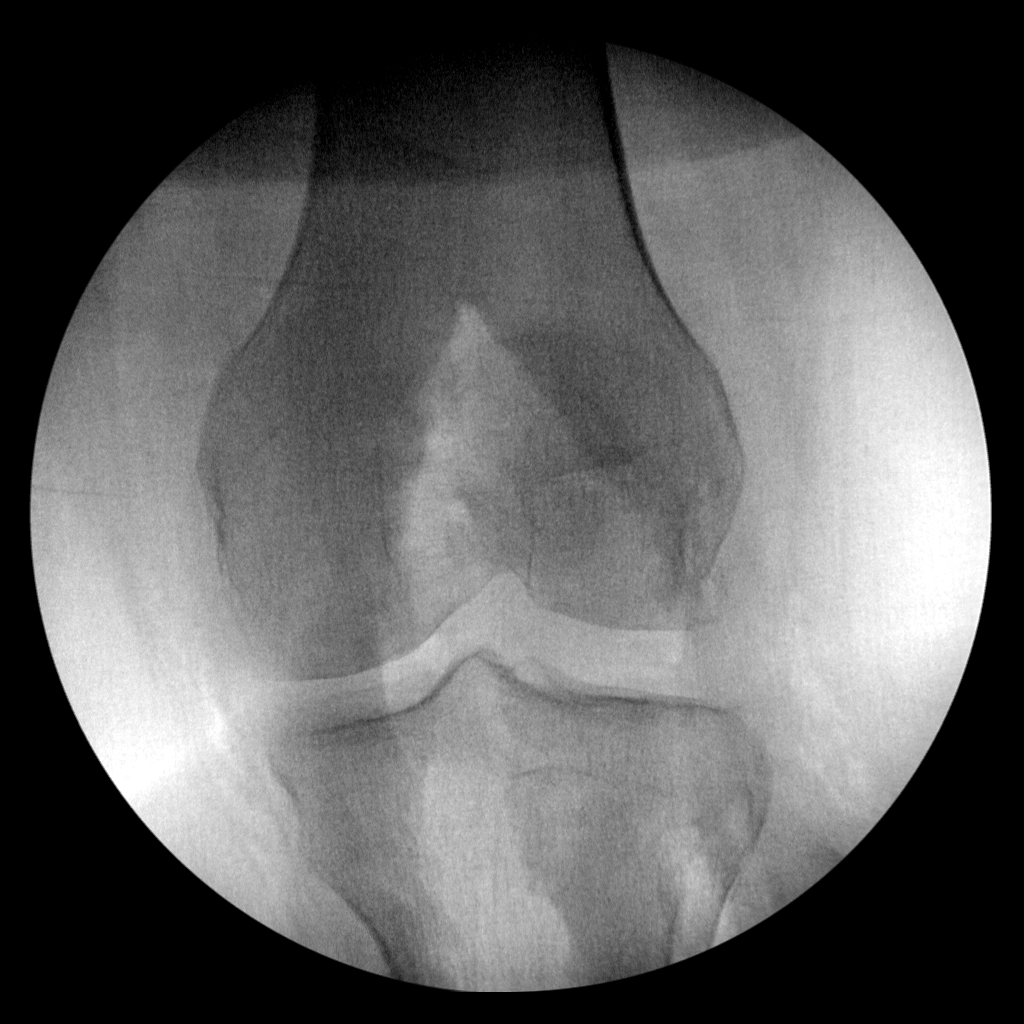

[5 of 5 positions shown; findings below may reference images not displayed]

FINDINGS: Four C-arm fluoroscopic images were obtained intraoperatively and
submitted for post operative interpretation. These images
demonstrate surgical changes associated with arthroscopy for
reported chondroplasty with loose body removal and MPFL
reconstruction. Screws in the tibia likely related to reported
tibial osteotomy. Please see the performing provider's procedural
report for further detail.
IMPRESSION: Intraoperative fluoroscopy, as detailed above.

## 2021-12-17 ENCOUNTER — Encounter: Payer: BC Managed Care – PPO | Attending: Family Medicine | Admitting: Dietician

## 2021-12-17 ENCOUNTER — Other Ambulatory Visit: Payer: Self-pay

## 2021-12-17 ENCOUNTER — Encounter: Payer: Self-pay | Admitting: Dietician

## 2021-12-17 DIAGNOSIS — Z713 Dietary counseling and surveillance: Secondary | ICD-10-CM | POA: Diagnosis not present

## 2021-12-17 DIAGNOSIS — Z6841 Body Mass Index (BMI) 40.0 and over, adult: Secondary | ICD-10-CM | POA: Insufficient documentation

## 2021-12-17 NOTE — Patient Instructions (Signed)
Start using hand weights/ resistance bands for some strength building exercises. Search online for ideas. [email or web address] click on your wellbeing tab, then wellness matters, filter type as video, filter topic as diet and exercise Take in more calories earlier in the day -- try a protein drink in the morning OK to add a fruit, vegetable like salad or carrots to a frozen meal at lunch.  Take dinner plate away from the kitchen, or use a smaller plate, eat more low carb vegetables to help control portions of high calorie foods. Eat the vegetables first, or the protein, and save the starch (especially fries) for last.  Keep a food diary for 3-7 days or longer. Write down or track all foods and drinks along with portions.

## 2021-12-17 NOTE — Progress Notes (Signed)
Medical Nutrition Therapy: Visit start time: [id number]  end time: 1645  Assessment:  Diagnosis: obesity Past medical history: none significant Psychosocial issues/ stress concerns: none  Preferred learning method:  Visual   Current weight: 507.4lbs Height: 6'3" BMI: 63.42  Medications, supplements: reconciled list in medical record  Progress and evaluation:  Patient reports weight gain throughout high school, he estimates 50lb each year of high school. He lives with both parents, twin brother, sister and husband and 2 nephews. Sister cooks dinner meals most evenings for the entire family.  Skips breakfast, wakes up and leaves for work within 10 minutes.  Has knee pain and soreness after work most days, residual from accident-related injury and 2 surgeries. He has eliminated sodas which he used to drink throughout the day. He feels he needs to work on food portions, especially [place] fries.  Physical activity: 6000-7000 steps during workday, using [employer] app to track  Dietary Intake:  Usual eating pattern includes 2 meals and 1-2 snacks per day. Dining out frequency: weekend meals  Breakfast: skips Snack: none Lunch: lean cuisine lasagna or salisbury steak frozen meal  Snack: none crackers with peanut butter lance; banana once a week; breakfast doughnut Supper: chicken from frozen tenders + fries or mashed potatoes Snack: none Beverages: water, tea with 1.5c sugar for 1 gallon, now diluting with water  Nutrition Care Education: Topics covered:  Basic nutrition: basic food groups, appropriate nutrient balance, appropriate meal and snack schedule, general nutrition guidelines    Weight control: importance of low sugar and low fat choices; portion control strategies including using smaller plates, increasing low carb veg and eating them first, combining veg with starchy foods, avoiding distractions while eating, distancing from serving containers; estimated energy needs for weight  loss at 2200 kcal, provided guidance for 45% CHO, 25% pro, 30% fat; tracking food intake to increase awareness of portions and choices; discussed role of physical activity and options    Nutritional Diagnosis:  Vazquez-3.3 Overweight/obesity As related to excess calories and inadequate physical activity.  As evidenced by patient with current BMI of 63.4, working on diet and lifestyle changes to promote weight loss.  Intervention:  Instruction and discussion as noted above. Patient is working on gradual diet and lifestyle changes, he is motivated to continue. He has goal of feeling better and eventually weight lifting/ body building Established goals for additional changes with direction from patient.  Education Materials given:  Museum/gallery conservator with food lists, sample meal pattern Visit summary with goals/ instructions   Learner/ who was taught:  Patient   Level of understanding: Verbalizes/ demonstrates competency   Demonstrated degree of understanding via:   Teach back Learning barriers: None  Willingness to learn/ readiness for change: Eager, change in progress   Monitoring and Evaluation:  Dietary intake, exercise, and body weight      follow up:  02/06/22 at 3:30pm

## 2022-02-06 ENCOUNTER — Encounter: Payer: BC Managed Care – PPO | Admitting: Dietician

## 2022-02-14 ENCOUNTER — Encounter: Payer: BC Managed Care – PPO | Attending: Family Medicine | Admitting: Dietician

## 2022-02-14 ENCOUNTER — Encounter: Payer: Self-pay | Admitting: Dietician

## 2022-02-14 NOTE — Patient Instructions (Signed)
Consider taking a multivitamin daily to help make sure to get enough nutrients in the day. ?Continue with action VR games and other activity daily, great job! ?Keep eating at least 3 meals a day; add 1-2 snacks or add more to lunch or breakfast meals if hungry. Current calorie intake is low for height, age, and basic nutrition needs. ?

## 2022-02-14 NOTE — Progress Notes (Signed)
Medical Nutrition Therapy: Visit start time: 1640  end time: 1710  ?Assessment:  Diagnosis: obesity ?Medical history changes: recent upper respiratory illness ?Psychosocial issues/ stress concerns: none ? ?Current weight: 508.1lbs Height: 6'3" BMI: 63.51 ? ?Medications, supplement changes: reconciled list in medical record ? ?Progress and evaluation:  ?Patient reports doing well wit diet for the first several weeks after initial visit; father then had knee surgery and pt was with dad in hosp most of the day. He resumed higher calorie eating pattern for that time, and was mostly inactive. ?Depriest was ill with URI 2 weeks ago, took prednisone and other meds, now recovered. ?He has worked to resume lower calorie eating pattern for the past several days, started drinking protein shake in am today and states he felt better during the day.  ?  ?Physical activity: on the job activity; VR active games 2-3 times a week ? ?Dietary Intake:  ?Usual eating pattern includes 3 meals and 0-1 snacks per day ?Dining out frequency: 2-3 meals per week, weekends ? ?Breakfast: protein shake today with 8+oz whole milk; history of skipping ?Snack: none ?Lunch: lean cuisine meals ?Snack: usually none; occ fruit or crackers with peanut butter ?Supper: keeps to 1 plate of food most meals; 4/26 shrimp alfredo ?Snack: none ?Beverages: water, 1/2 sweet tea with 1/2 water ? ?Intervention:  ? ?Nutrition Care Education: ? ?Basic nutrition: reviewed appropriate nutrient balance; appropriate meal and snack schedule; general nutrition guidelines; vitamin supplementation for low calorie eating pattern   ?Weight control: reviewed progress since previous visit; effects of under-eating on weight loss and metabolism; importance of eating at regular intervals, consuming calories throughout day rather than mostly in the evenings; discussed role of exercise. ? ?Additional Notes: ?Patient feels comfortable following his current eating pattern which is  significantly lower than his actual nutritional needs. Advised gradual increase in caloric consumption throughout the day rather than with evening meal. Advised multivitamin to prevent potential deficiencies. ? ? ?Nutritional Diagnosis:  Chapman-3.3 Overweight/obesity As related to history of excess calories and inadequate physical activity.  As evidenced by patient with current BMI of 63.4, resuming low calorie eating pattern to promote weight loss. ? ? ?Education Materials given:  ?Visit summary with goals/ instructions ? ? ?Learner/ who was taught:  ?Patient  ? ?Level of understanding: ?Verbalizes/ demonstrates competency ? ? ?Demonstrated degree of understanding via:   Teach back ?Learning barriers: ?None ? ?Willingness to learn/ readiness for change: ?Eager, change in progress ? ? ?Monitoring and Evaluation:  Dietary intake, exercise, and body weight ?     follow up:  03/21/22 at 4:00pm   ?

## 2022-03-21 ENCOUNTER — Ambulatory Visit: Payer: BC Managed Care – PPO | Admitting: Dietician

## 2022-04-10 ENCOUNTER — Encounter: Payer: Self-pay | Admitting: Dietician

## 2022-04-10 NOTE — Progress Notes (Signed)
Patient cancelled his appointment for 03/21/22, and did not wish to reschedule. Sent notification to referring provider.

## 2022-05-20 IMAGING — US US EXTREM LOW VENOUS*L*
1 series · 13 of 24 positions shown · non-contrast
Comparison: 03/01/2021

CLINICAL DATA: History of DVT on Xarelto, follow-up exam



[Series 1: us venous img lower uni left (dvt) · portal-venous · 13 of 28 slices shown]
[im 1/28]
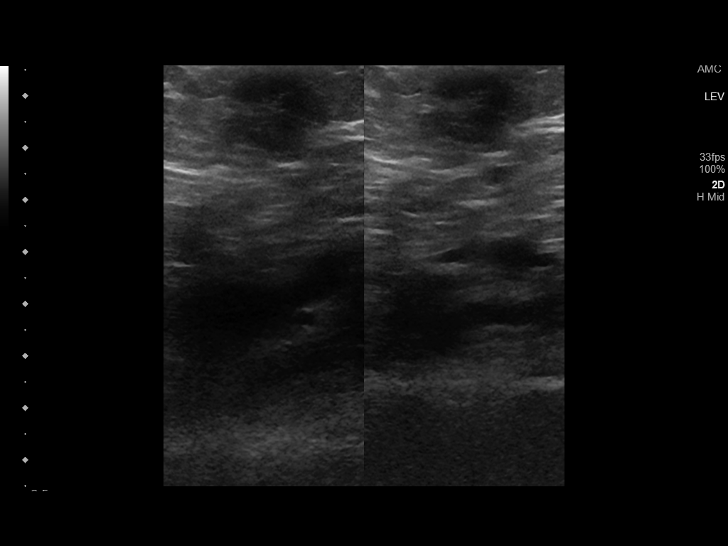
[im 3/28]
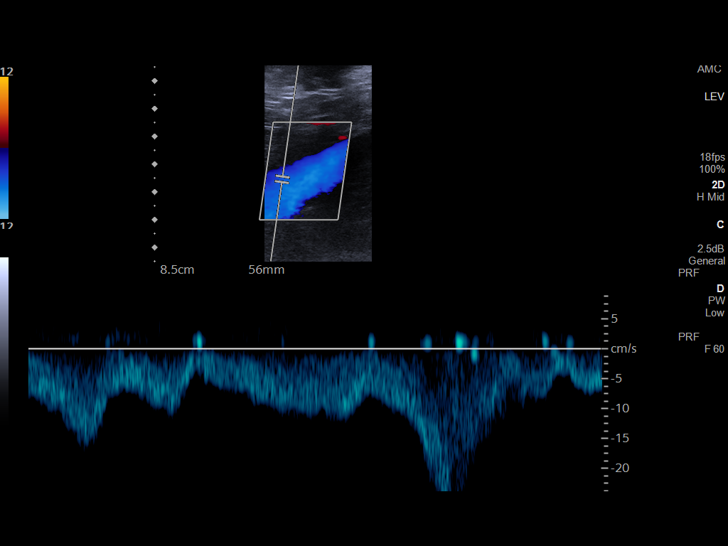
[im 5/28]
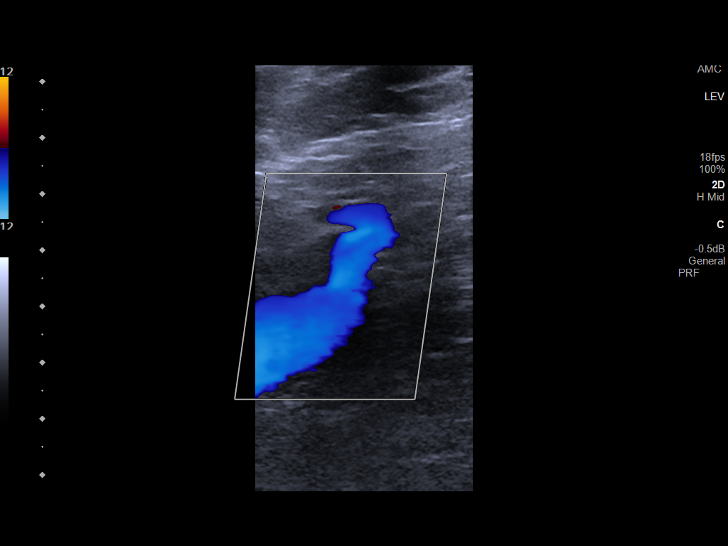
[im 8/28]
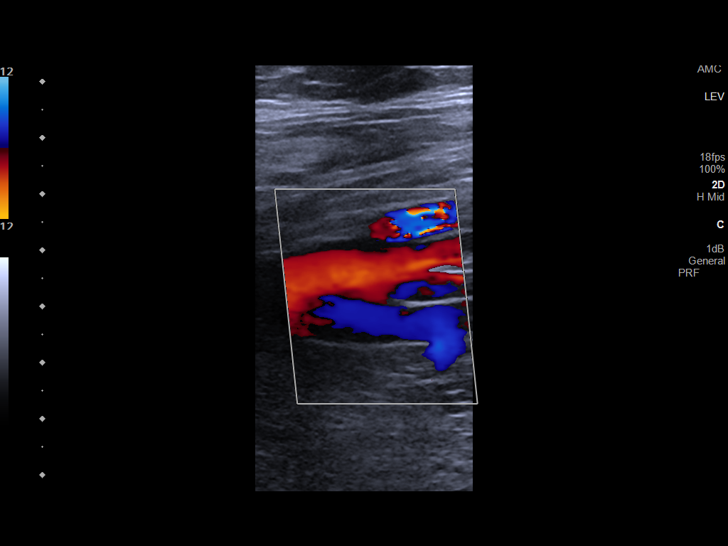
[im 10/28]
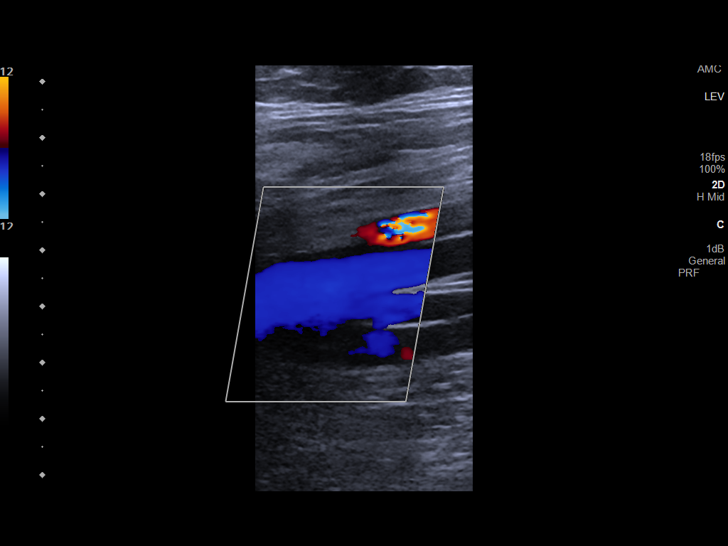
[im 12/28]
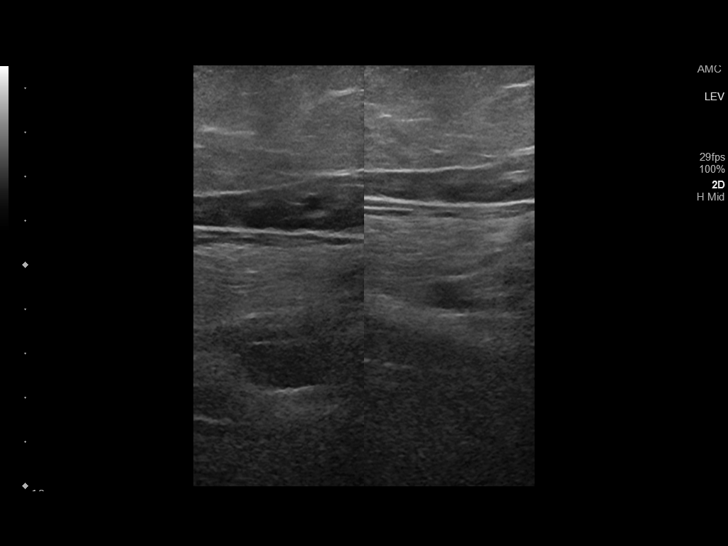
[im 15/28]
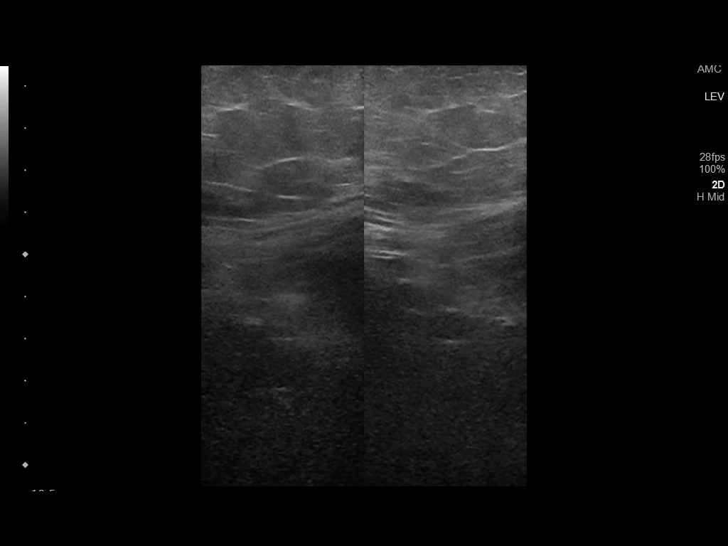
[im 16/28]
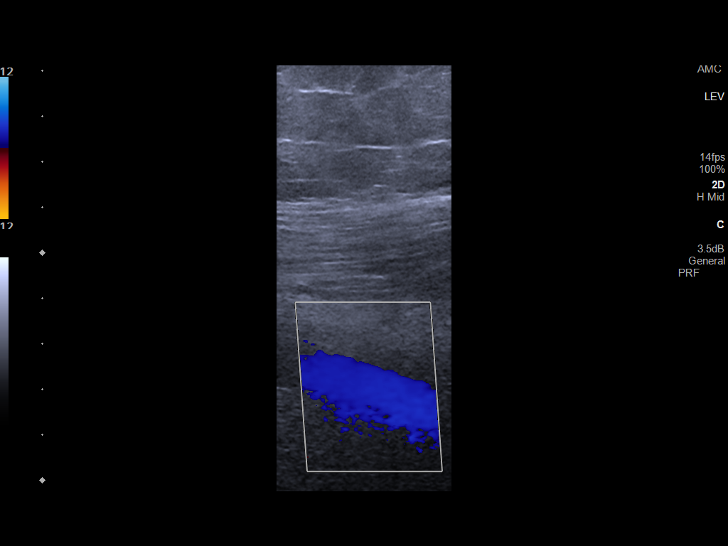
[im 18/28]
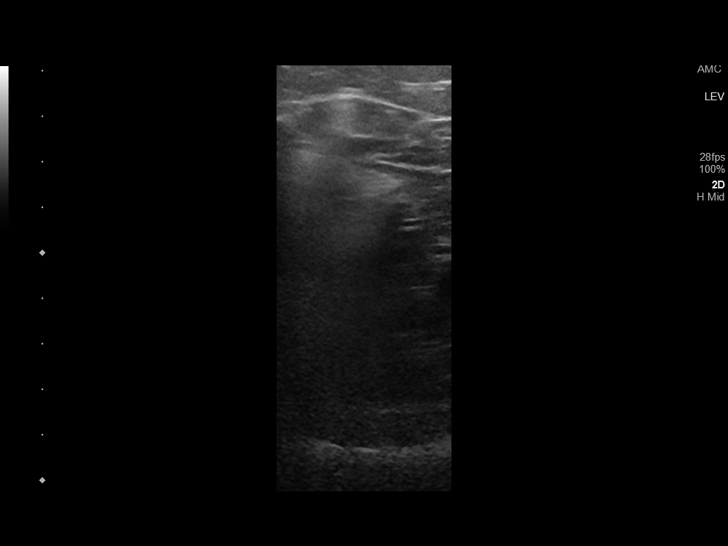
[im 20/28]
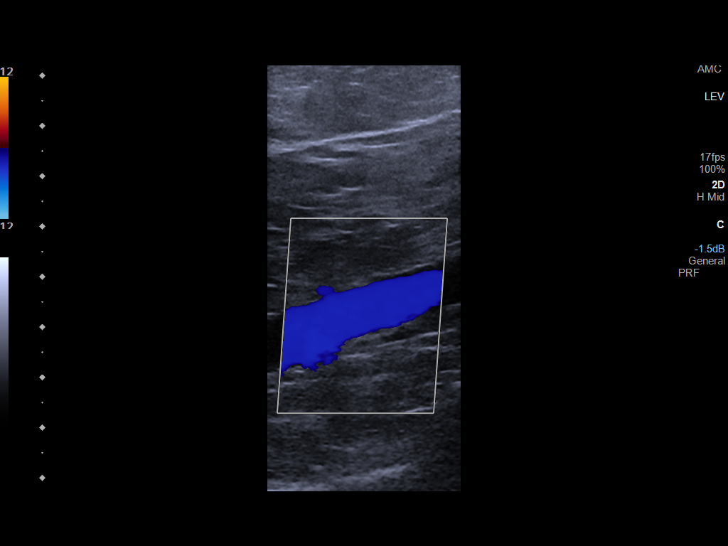
[im 23/28]
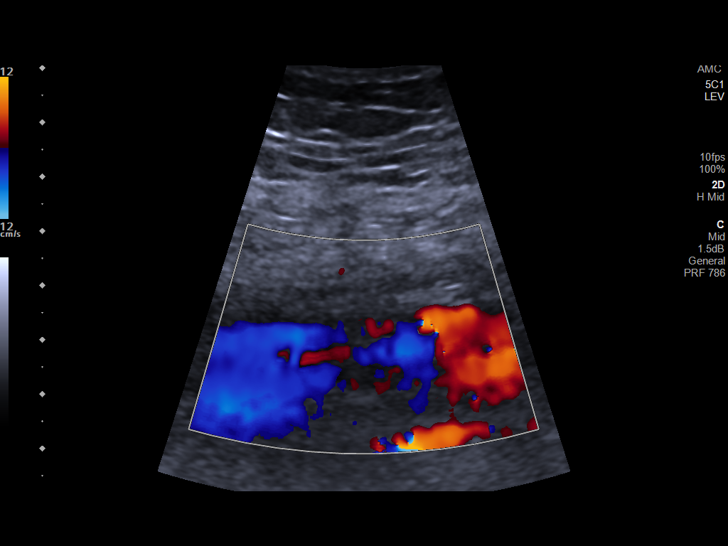
[im 25/28]
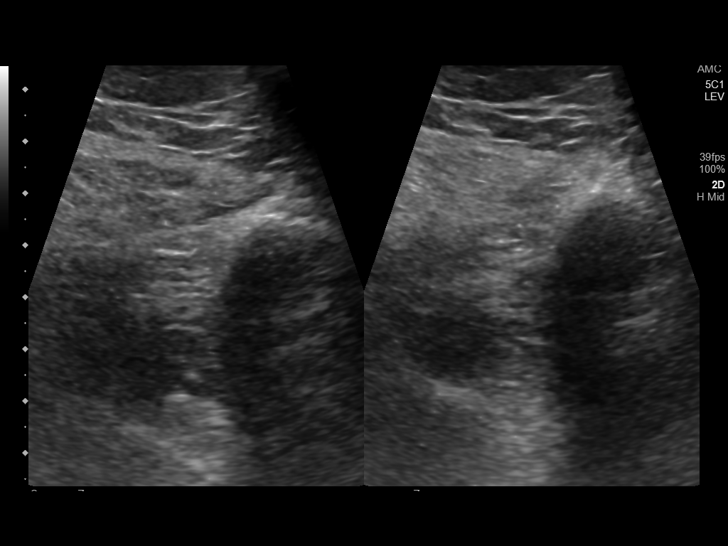
[im 28/28]
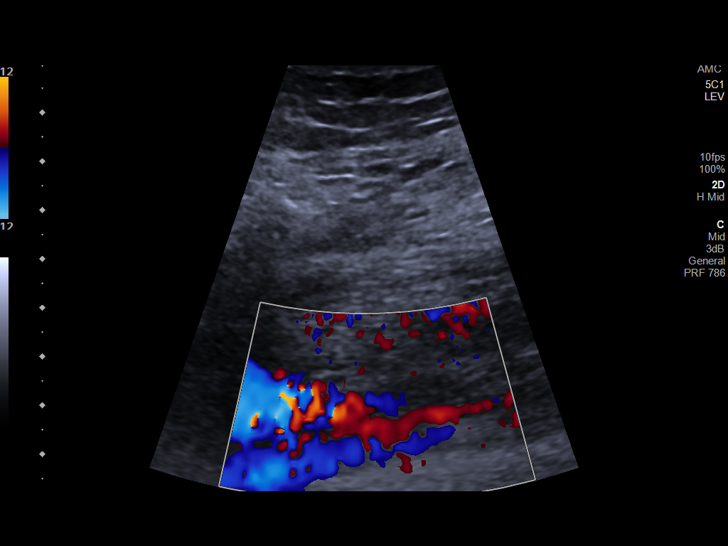

[13 of 24 positions shown; findings below may reference images not displayed]

FINDINGS: Contralateral Common Femoral Vein: Respiratory phasicity is normal
and symmetric with the symptomatic side. No evidence of thrombus.
Normal compressibility.

Common Femoral Vein: No evidence of thrombus. Normal
compressibility, respiratory phasicity and response to augmentation.

Saphenofemoral Junction: No evidence of thrombus. Normal
compressibility and flow on color Doppler imaging.

Profunda Femoral Vein: No evidence of thrombus. Normal
compressibility and flow on color Doppler imaging.

Femoral Vein: No evidence of thrombus. Normal compressibility,
respiratory phasicity and response to augmentation.

Popliteal Vein: No evidence of thrombus. Normal compressibility,
respiratory phasicity and response to augmentation.

Calf Veins: Limited assessment of the calf veins because of body
habitus. No gross residual thrombus appreciated.
IMPRESSION: No significant DVT in the left lower extremity. Limited assessment
of the calf veins.

## 2024-06-28 NOTE — Progress Notes (Signed)
 Subjective:    Chief Complaint  Patient presents with  . Diarrhea    X 06/23/24 Pt states he began having diarrhea last Wednesday, multiple times a day w/ sulfur like burps Pt denies abdominal pain, indigestion, nausea/vomiting Pt states diarrhea became bloody yesterday, w/ red tinges Pt states diarrhea is brown and orange Pt states he's taken pepto bismol w/ last dose yesterday, w/ no relief Pt is eating and drinking, states pain is burning in rectum w/ 7/10 rating    History was provided by the patient. Todd Ward is a 23 y.o., male  who presents with 5-day history of crampy lower abdominal discomfort, diarrhea.  Reports some blood specks recently, with rectal irritation.  Reports ate at [facility] after a conference, has had diarrhea since.  No complaints of headache or dizziness; no sore throat or cough; no chest pain or shortness of breath; no nausea, or vomiting.  No known fever or chills.  Taking Pepto-Bismol without relief. Symptoms include: above   Patient denies: above Treatment to date: above   Past Medical History:  Diagnosis Date  . Diabetes mellitus without complication (CMS/HHS-HCC)   . Hyperlipidemia   . Hypertension    The following portions of the patient's history were reviewed and updated as appropriate: allergies, current medications, past social history, and problem list.  Review of Systems A complete review of systems was performed.  Positive and pertinent negative responses are documented in the HPI, and all other systems are negative.   Objective:     Vitals:   06/28/24 0855  BP: 138/78  Pulse: 65  Temp: 36.6 C (97.8 F)  TempSrc: Oral  SpO2: 100%  Weight: (!) 199 kg (438 lb 12.8 oz)  Height: 188 cm (6' 2)  PainSc:   7  PainLoc: Rectum    General Appearance: Morbidly obese.  Pleasant and cooperative.  No acute distress. HEENT:  [place]/AT, PERRLA, EOMI, sclera clear, conjunctivae pink.   Neck:  Supple, no JVD or  adenopathy. Cardiovascular:  Regular rate and rhythm.  Lungs:  Clear to auscultation. Abdomen:  Soft, nontender, nondistended.  Hyperactive bowel sounds.  No rebound or guarding. GU: Deferred Back: No CVA tenderness. Extremities:    No cyanosis, clubbing, or edema. Neuro:  Alert and orient x4; nonfocal.    Lab/X-ray/Treatments:   CBC-8.3 K, 65% neutrophils, 22% lymphocytes UA-clear       Assessment:      1. Diarrhea, unspecified type -     CBC w/auto Differential (5 Part) -     Urinalysis w/Microscopic -     ciprofloxacin HCl (CIPRO) 500 MG tablet; Take 1 tablet (500 mg total) by mouth 2 (two) times daily for 5 days  Dispense: 10 tablet; Refill: 0 -     metroNIDAZOLE (FLAGYL) 500 MG tablet; Take 1 tablet (500 mg total) by mouth 3 (three) times daily for 15 doses Do not drink alcohol while taking metronidazole.  Dispense: 15 tablet; Refill: 0 -     promethazine (PHENERGAN) 25 MG tablet; Take 1 tablet (25 mg total) by mouth every 6 (six) hours as needed for Nausea (or mild discomfort) for up to 7 days  Dispense: 15 tablet; Refill: 0  2. Nausea  3. Rectal irritation -     pramoxine (PROCTOFOAM) 1 % foam; Place rectally every 4 (four) hours as needed for Hemorrhoids for up to 7 days  Dispense: 15 g; Refill: 0   Will treat for infectious diarrhea, Cipro, Flagyl, fluids.  Proctofoam for rectal irritation.  Phenergan for GI upset.  Work note provided.  Caution regarding sunburn or alcohol.  Call return if any further problems.    Plan:   Requested Prescriptions   Signed Prescriptions Disp Refills  . ciprofloxacin HCl (CIPRO) 500 MG tablet 10 tablet 0    Sig: Take 1 tablet (500 mg total) by mouth 2 (two) times daily for 5 days  . metroNIDAZOLE (FLAGYL) 500 MG tablet 15 tablet 0    Sig: Take 1 tablet (500 mg total) by mouth 3 (three) times daily for 15 doses Do not drink alcohol while taking metronidazole.  . promethazine (PHENERGAN) 25 MG tablet 15 tablet 0    Sig: Take 1 tablet  (25 mg total) by mouth every 6 (six) hours as needed for Nausea (or mild discomfort) for up to 7 days  . pramoxine (PROCTOFOAM) 1 % foam 15 g 0    Sig: Place rectally every 4 (four) hours as needed for Hemorrhoids for up to 7 days

## 2024-08-23 NOTE — Progress Notes (Signed)
 Chief Complaint  Patient presents with  . Follow-up    HPI  Todd Ward is a 23 y.o. male here for f/u of chronic medical issues  Morbid obesity: Patient reports that he has opted to remain off of GLP-1 such as Zepbound.  He is trying to implement intermittent fasting and decrease caloric intake and improving quality of exercise.  He has gained 6 pounds since last visit.  Denies any chest pain or CVA symptoms.  No dizziness or passing out.  ROS Review of systems is unremarkable for any active cardiac, respiratory, GI, GU, hematologic, neurologic, dermatologic, HEENT, or psychiatric symptoms except as noted above.  No fevers, chills, or constitutional symptoms.   Current Outpatient Medications  Medication Sig Dispense Refill  . melatonin 2.5 mg Chew Take by mouth at bedtime as needed    . naproxen sodium (ALEVE) 220 MG tablet Take 220 mg by mouth 2 (two) times daily as needed for Pain     No current facility-administered medications for this visit.    Allergies as of 08/23/2024  . (No Known Allergies)    Patient Active Problem List  Diagnosis  . Morbid obesity due to excess calories (CMS-HCC)  . 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 Diabetes mellitus without complication (CMS/HHS-HCC)   . Hyperlipidemia   . Hypertension     Past Surgical History:  Procedure Laterality Date  . Arthoscopy knee, lateral release, loose nody removal, left Left 02/16/2016   Dr. Kathlynn  . Left knee tibial tubercle osteotomy, medial patellofemoral ligament reconstruction using allograft, open lateral retinacular release, chondroplasty of trochlea Left 02/19/2021   Dr. Tobie    Vitals:   08/23/24 1333  BP: (!) 144/70  Pulse: 83  SpO2: 98%  Weight: (!) 204.1 kg (450 lb)  Height: 188 cm (6' 2)  PainSc: 0-No pain   Body mass index is 57.78 kg/m.  Exam  General. Well appearing; NAD; VS  reviewed     Eyes. Sclera and conjunctiva clear; Vision grossly intact; extraocular movements intact Neck. Supple.  Lungs. Respirations unlabored; clear to auscultation bilaterally Cardiovascular. Heart regular rate and rhythm without murmurs, gallops, or rubs Skin. Normal color and turgor Neurologic. Alert and oriented x3; CN 2-12 grossly intact; no focal deficits  Assessment and Plan  1. Morbid obesity due to excess calories (CMS-HCC) Worsened.  Wants to remain off of GLP-1's and do this with lifestyle modifications.  I agree that this is reasonable.  Agree with intermittent fasting.  Focus more on increased activity and exercise.  If he changes his mind about GLP-1's, I am open to represcribing medication. -     Comprehensive Metabolic Panel (CMP); Future -     CBC w/auto Differential (5 Part); Future -     Hemoglobin A1C; Future -     Lipid Panel w/calc LDL; Future  Other orders -     Follow up in Primary Care -     Follow up in Primary Care; Future  Follow-up 6 months for PE.  Labs prior  ALDA CARPEN, MD

## 2024-09-20 NOTE — Progress Notes (Signed)
 Chief Complaint:   Chief Complaint  Patient presents with  . Cough    X 09/18/24  C/o nasal congestion, ST - dysphagia, nonproductive cough  Denies fever, n/v/d, SOB, CP  Reports taking lozenges, some relief   . Sore Throat    Subjective:   Todd Ward is a 23 y.o. male established patient in today for:  HPI Patient presents to clinic with sore throat. Symptom onset 2 days ago. Also reports a mild, intermittent, non-productive cough. No fevers, chills, body aches, sinus pressure or congestion (despite above), ear pain, difficulty swallowing, abdominal pain, nausea, vomiting, diarrhea, rash, headache, dizziness, or loss of consciousness. His girlfriend has been sick with similar symptoms over the last week. He has been using Cepacol lozenges with temporary relief. Of note, he does report experiencing right sided lower rib pain that began 4 days ago. It is described as having been constant, sharp and 9/10 superficial rib pain that was worse with laughing or deep inspiration. No exertional chest pain or shortness of breath. He states it felt like prior muscle cramps he has had for which his PCP recommended he take potassium supplements for. He notes taking this supplement resolved the pain 2 days ago, no pain since. Denies calf pain or leg swelling. PMHx significant for HTN, HLD, and T2DM.     Past Medical History:  Diagnosis Date  . Diabetes mellitus without complication (CMS/HHS-HCC)   . Hyperlipidemia   . Hypertension     Past Surgical History:  Procedure Laterality Date  . Arthoscopy knee, lateral release, loose nody removal, left Left 02/16/2016   Dr. Kathlynn  . Left knee tibial tubercle osteotomy, medial patellofemoral ligament reconstruction using allograft, open lateral retinacular release, chondroplasty of trochlea Left 02/19/2021   Dr. Tobie    Outpatient Medications Prior to Visit  Medication Sig Dispense Refill  . melatonin 2.5 mg Chew Take by mouth at bedtime as  needed    . naproxen sodium (ALEVE) 220 MG tablet Take 220 mg by mouth 2 (two) times daily as needed for Pain     No facility-administered medications prior to visit.    No Known Allergies  Family History  Problem Relation Name Age of Onset  . No Known Problems Mother    . Diabetes Father Toribio   . High blood pressure (Hypertension) Father Toribio     Social History   Tobacco Use  . Smoking status: Never    Passive exposure: Never  . Smokeless tobacco: Never  Vaping Use  . Vaping status: Never Used  Substance Use Topics  . Alcohol use: No    Alcohol/week: 0.0 standard drinks of alcohol  . Drug use: No      Review of Systems  Pertinent positive and negative ROS as documented in HPI.    Objective:   Blood pressure 122/75, pulse 58, temperature 36.1 C (97 F), temperature source Oral, height 188 cm (6' 2), weight (!) 205.9 kg (454 lb), SpO2 99%.  Physical Exam Vitals and nursing note reviewed.  Constitutional:      General: He is not in acute distress.    Appearance: Normal appearance. He is not ill-appearing, toxic-appearing or diaphoretic.  HENT:     Head: Normocephalic and atraumatic.     Right Ear: Ear canal normal.     Left Ear: Ear canal normal.     Ears:     Comments: Bilateral TM scarring, patient reports a history of tympanostomy tubes    Nose: Nose normal.  Comments: No TTP of sinuses.    Mouth/Throat:     Mouth: Mucous membranes are moist.     Pharynx: Posterior oropharyngeal erythema present.     Comments: Tonsillar hypertrophy. No trismus. Uvula midline. Managing secretions.  Eyes:     General: No scleral icterus.       Right eye: No discharge.        Left eye: No discharge.     Conjunctiva/sclera: Conjunctivae normal.     Pupils: Pupils are equal, round, and reactive to light.  Cardiovascular:     Rate and Rhythm: Normal rate and regular rhythm.     Heart sounds: Normal heart sounds.  Pulmonary:     Effort: Pulmonary effort is normal.  No respiratory distress.     Breath sounds: Normal breath sounds. No wheezing, rhonchi or rales.     Comments: Respiratory exam as documented. Chest:     Chest wall: No tenderness.  Abdominal:     General: Bowel sounds are normal.     Palpations: Abdomen is soft.     Tenderness: There is no abdominal tenderness. There is no guarding or rebound.     Comments: Gastrointestinal exam as documented  Musculoskeletal:     Cervical back: Normal range of motion and neck supple.  Lymphadenopathy:     Cervical: No cervical adenopathy.  Skin:    General: Skin is warm and dry.  Neurological:     Mental Status: He is alert.  Psychiatric:        Mood and Affect: Mood normal.        Behavior: Behavior normal.     Results for orders placed or performed in visit on 09/20/24  Xpert Dx Strep A, PCR - [facility]   Specimen: Throat  Result Value Ref Range   Xpress Strep A, PCR Not Detected Not Detected, INVALID  Extended Respiratory Viral Panel - [facility]   Specimen: Nasal Swab; Other  Result Value Ref Range   Influenza A PCR Negative Negative   Influenza B PCR Negative Negative   RSV PCR Negative Negative   SARS-CoV2 PCR Negative Negative   Narrative   Positive  Positive results are indicative of the presence of the identified virus, but do not rule out bacterial infection or co-infection with other pathogens not detected by the test. Clinical correlation with patient history and other diagnostic information is necessary to determine patient infection status.  Negative  Negative results do not preclude SARS-CoV-2 infection, Influenza A virus, Influenza B virus and/or RSV infection and should not be used as the sole basis for treatment or other patient management decisions. Negative results must be combined with clinical observations, patient history, and/or epidemiological information.     CBC w/auto Differential (5 Part)  Result Value Ref Range   WBC (White Blood Cell Count) 7.2 4.1 - 10.2  10^3/uL   RBC (Red Blood Cell Count) 4.89 4.69 - 6.13 10^6/uL   Hemoglobin 13.7 (L) 14.1 - 18.1 gm/dL   Hematocrit 58.7 59.9 - 52.0 %   MCV (Mean Corpuscular Volume) 84.3 80.0 - 100.0 fl   MCH (Mean Corpuscular Hemoglobin) 28.0 27.0 - 31.2 pg   MCHC (Mean Corpuscular Hemoglobin Concentration) 33.3 32.0 - 36.0 gm/dL   Platelet Count 806 849 - 450 10^3/uL   RDW-CV (Red Cell Distribution Width) 12.9 11.6 - 14.8 %   MPV (Mean Platelet Volume) 10.2 9.4 - 12.4 fl   Neutrophils 4.01 1.50 - 7.80 10^3/uL   Lymphocytes 2.01 1.00 - 3.60 10^3/uL  Monocytes 0.59 0.00 - 1.50 10^3/uL   Eosinophils 0.49 0.00 - 0.55 10^3/uL   Basophils 0.05 0.00 - 0.09 10^3/uL   Neutrophil % 55.4 32.0 - 70.0 %   Lymphocyte % 27.8 10.0 - 50.0 %   Monocyte % 8.1 4.0 - 13.0 %   Eosinophil % 6.8 (H) 1.0 - 5.0 %   Basophil% 0.7 0.0 - 2.0 %   Immature Granulocyte % 1.2 (H) <=0.7 %   Immature Granulocyte Count 0.09 (H) <=0.06 10^3/L  Mononucleosis Spot Test  Result Value Ref Range   Mononucleosis Spot Test Negative Negative   Narrative   Caution: False negative results are more likely in children under 56 years of age.     Assessment/Plan:   1. Encounter for observation for suspected exposure to other biological agents ruled out -     Extended Respiratory Viral Panel - [facility]; Future  2. Sore throat -     Xpert Dx Strep A, PCR - [facility] -     CBC w/auto Differential (5 Part) -     Mononucleosis Spot Test  3. Acute pharyngitis, unspecified etiology -     amoxicillin (AMOXIL) 500 MG tablet; Take 1 tablet (500 mg total) by mouth 2 (two) times daily for 10 days  Dispense: 20 tablet; Refill: 0  Patient is pleasant, cooperative, in no apparent distress. Vitals reviewed, stable. Lungs clear. No respiratory distress. Here mainly with sore throat and intermittment, dry, mild cough. Does have posterior oropharynx erythema with tonsillar hypertrophy. Managing secretions. ERP negative. Strep negative. Given age and sxs,  will check CBC and mono.   Mono negative. WBC 7.2. Immature granulocytes are elevated. Neutrophils 55.4%. Will rx abx to cover for bacterial pharyngitis, although still could be viral. Further symptomatic treatment discussed. Patient questions addressed. Follow up with PCP if no improvement. ED precautions provided in patient d/c instructions. Stable for discharge.    Patient was also seen and evaluated by PA student. Reviewed history and physical exam; discussed assessment and plan as medical decision making. I performed hx and physical exam myself. Information documented has been verified by me.    Attestation Statement:   I personally performed the service, non-incident to. (WP)   ASHLEY BRIDGETTE POISSON, PA       Patient Instructions  Please take antibiotics as prescribed for treatment. Complete the entire course even if feeling better.  Tylenol  or Ibuprofen as directed on packaging for discomfort.  Warm salt water gargles and OTC throat drops a few times a day may also help.  Replace toothbrush 2-3 days after starting medication. Follow up with PCP or in clinic if symptoms persist despite treatment.  Go to ED if develop neck stiffness, drooling, unable to open mouth, trouble breathing, chest pain, sudden/severe headache, neurologic symptoms.

## 2024-09-22 ENCOUNTER — Other Ambulatory Visit: Payer: Self-pay

## 2024-09-22 ENCOUNTER — Inpatient Hospital Stay
Admission: EM | Admit: 2024-09-22 | Discharge: 2024-09-26 | DRG: 164 | Disposition: A | Attending: Hospitalist | Admitting: Hospitalist

## 2024-09-22 ENCOUNTER — Encounter: Payer: Self-pay | Admitting: *Deleted

## 2024-09-22 ENCOUNTER — Emergency Department

## 2024-09-22 DIAGNOSIS — R7989 Other specified abnormal findings of blood chemistry: Secondary | ICD-10-CM | POA: Diagnosis present

## 2024-09-22 DIAGNOSIS — Z8249 Family history of ischemic heart disease and other diseases of the circulatory system: Secondary | ICD-10-CM

## 2024-09-22 DIAGNOSIS — I82412 Acute embolism and thrombosis of left femoral vein: Secondary | ICD-10-CM | POA: Diagnosis present

## 2024-09-22 DIAGNOSIS — I10 Essential (primary) hypertension: Secondary | ICD-10-CM | POA: Diagnosis present

## 2024-09-22 DIAGNOSIS — Z86718 Personal history of other venous thrombosis and embolism: Secondary | ICD-10-CM

## 2024-09-22 DIAGNOSIS — E119 Type 2 diabetes mellitus without complications: Secondary | ICD-10-CM | POA: Diagnosis present

## 2024-09-22 DIAGNOSIS — Z79899 Other long term (current) drug therapy: Secondary | ICD-10-CM

## 2024-09-22 DIAGNOSIS — E785 Hyperlipidemia, unspecified: Secondary | ICD-10-CM | POA: Diagnosis present

## 2024-09-22 DIAGNOSIS — I824Z2 Acute embolism and thrombosis of unspecified deep veins of left distal lower extremity: Secondary | ICD-10-CM | POA: Diagnosis present

## 2024-09-22 DIAGNOSIS — E66813 Obesity, class 3: Secondary | ICD-10-CM | POA: Diagnosis present

## 2024-09-22 DIAGNOSIS — Z7901 Long term (current) use of anticoagulants: Secondary | ICD-10-CM

## 2024-09-22 DIAGNOSIS — I2692 Saddle embolus of pulmonary artery without acute cor pulmonale: Principal | ICD-10-CM | POA: Diagnosis present

## 2024-09-22 DIAGNOSIS — R0902 Hypoxemia: Secondary | ICD-10-CM | POA: Diagnosis present

## 2024-09-22 DIAGNOSIS — I890 Lymphedema, not elsewhere classified: Secondary | ICD-10-CM | POA: Diagnosis present

## 2024-09-22 DIAGNOSIS — Z6841 Body Mass Index (BMI) 40.0 and over, adult: Secondary | ICD-10-CM

## 2024-09-22 DIAGNOSIS — I2489 Other forms of acute ischemic heart disease: Secondary | ICD-10-CM | POA: Diagnosis present

## 2024-09-22 DIAGNOSIS — I272 Pulmonary hypertension, unspecified: Secondary | ICD-10-CM | POA: Diagnosis present

## 2024-09-22 LAB — BASIC METABOLIC PANEL WITH GFR
Anion gap: 12 (ref 5–15)
BUN: 14 mg/dL (ref 6–20)
CO2: 24 mmol/L (ref 22–32)
Calcium: 9.7 mg/dL (ref 8.9–10.3)
Chloride: 105 mmol/L (ref 98–111)
Creatinine, Ser: 0.86 mg/dL (ref 0.61–1.24)
GFR, Estimated: >60 mL/min (ref >60)
Glucose, Bld: 81 mg/dL (ref 70–99)
Potassium: 4 mmol/L (ref 3.5–5.1)
Sodium: 141 mmol/L (ref 135–145)

## 2024-09-22 LAB — CBC
HCT: 45.8 % (ref 39.0–52.0)
Hemoglobin: 15.3 g/dL (ref 13.0–17.0)
MCH: 27.8 pg (ref 26.0–34.0)
MCHC: 33.4 g/dL (ref 30.0–36.0)
MCV: 83.1 fL (ref 80.0–100.0)
Platelets: 208 K/uL (ref 150–400)
RBC: 5.51 MIL/uL (ref 4.22–5.81)
RDW: 12.7 % (ref 11.5–15.5)
WBC: 10 K/uL (ref 4.0–10.5)
nRBC: 0 % (ref 0.0–0.2)

## 2024-09-22 LAB — TROPONIN T, HIGH SENSITIVITY: Troponin T High Sensitivity: 42 ng/L — ABNORMAL HIGH (ref 0–19)

## 2024-09-22 MED ORDER — ASPIRIN 81 MG PO CHEW
324.0000 mg | CHEWABLE_TABLET | Freq: Once | ORAL | Status: AC
  Administered 2024-09-22: 324 mg via ORAL
  Filled 2024-09-22: qty 4

## 2024-09-22 NOTE — ED Triage Notes (Addendum)
 Pt reports that he has been having central  chest pain on and off today. Associated with lightheadedness, shortness of breath. SOB worse with exertion.

## 2024-09-22 NOTE — ED Provider Notes (Incomplete)
 [facility] Provider Note    Event Date/Time   First MD Initiated Contact with Patient 09/22/24 2308     (approximate)   History   Chest Pain   HPI  Todd Ward is a 23 y.o. 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  Patient states that a couple of days ago he had a mild sore throat and was evaluated urgent care and was negative for strep and COVID influenza.  Today he was having a constant pain to the middle of his chest that was a sharp pain.  States that it was just a mild nagging pain.  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  He is normally able to do that without any significant problem.  Tonight had significant shortness of breath and felt like he might pass out and had to take a rest.  Broke up to a sweat.  Denies any nausea, vomiting.  Mild shortness of breath that is worse with exertion.  Did have a provoked DVT following a surgery and had 6 months of anticoagulation and is not on any anticoagulation at this time.  Not on any daily medications.  No prior stress testing or cardiac catheterization.  No family history coronary artery disease at a young age.  No tobacco use.     Physical Exam   Triage Vital Signs: ED Triage Vitals  Encounter Vitals Group     BP 09/22/24 2148 (!) 145/95     Girls Systolic BP Percentile --      Girls Diastolic BP Percentile --      Boys Systolic BP Percentile --      Boys Diastolic BP Percentile --      Pulse Rate 09/22/24 2148 (!) 107     Resp 09/22/24 2148 (!) 22     Temp 09/22/24 2148 97.9 F (36.6 C)     Temp Source 09/22/24 2148 Oral     SpO2 09/22/24 2148 98 %     Weight 09/22/24 2149 (!) 450 lb (204.1 kg)     Height 09/22/24 2149 6' 2 (1.88 m)     Head Circumference --      Peak Flow --       Pain Score 09/22/24 2148 7     Pain Loc --      Pain Education --      Exclude from Growth Chart --     Most recent vital signs: Vitals:   09/22/24 2148  BP: (!) 145/95  Pulse: (!) 107  Resp: (!) 22  Temp: 97.9 F (36.6 C)  SpO2: 98%    Physical Exam Constitutional:      Appearance: He is well-developed. He is obese.  HENT:     Head: Atraumatic.  Eyes:     Conjunctiva/sclera: Conjunctivae normal.  Cardiovascular:     Rate and Rhythm: Regular rhythm.     Heart sounds: Normal heart sounds. No murmur heard.    No friction rub.  Pulmonary:     Effort: Pulmonary effort is normal. No respiratory distress.  Musculoskeletal:        General: Normal range of motion.     Cervical back: Normal range of motion.     Right lower leg: No edema.     Left  lower leg: No edema.  Skin:    General: Skin is warm.     Capillary Refill: Capillary refill takes less than 2 seconds.  Neurological:     Mental Status: He is alert. Mental status is at baseline.     IMPRESSION / MDM / ASSESSMENT AND PLAN / ED COURSE  I reviewed the triage vital signs and the nursing notes.  Differential diagnosis including pulmonary embolism, ACS, pericarditis, pneumonia, CHF  Low suspicion for dissection, no tearing chest pain  Moderate risk Wells criteria given prior history of DVT and tachycardic on exam   EKG  I, Clotilda Punter, the attending physician, personally viewed and interpreted this ECG.  EKG showed sinus tachycardia.  Normal intervals.  No chamber enlargement.  No significant ST elevation or depression.  No findings of acute ischemia or dysrhythmia.  Sinus tachycardia while on cardiac telemetry.  RADIOLOGY I independently reviewed imaging, my interpretation of imaging: Chest x-ray no signs of pneumonia, no widened mediastinum and no pulmonary edema.  Read as no acute findings.  On my evaluation CTA with concern for pulmonary embolism.  LABS (all labs ordered are listed, but only  abnormal results are displayed) Labs interpreted as -    Labs Reviewed  APTT - Abnormal; Notable for the following components:      Result Value   aPTT 43 (*)    All other components within normal limits  TROPONIN T, HIGH SENSITIVITY - Abnormal; Notable for the following components:   Troponin T High Sensitivity 42 (*)    All other components within normal limits  TROPONIN T, HIGH SENSITIVITY - Abnormal; Notable for the following components:   Troponin T High Sensitivity 28 (*)    All other components within normal limits  BASIC METABOLIC PANEL WITH GFR  CBC  PROTIME-INR  CARDIOLIPIN ANTIBODIES, IGG, IGM, IGA  ANTITHROMBIN III  BETA-2-GLYCOPROTEIN I ABS, IGG/M/A  FACTOR 5 LEIDEN  HOMOCYSTEINE  LUPUS ANTICOAGULANT PANEL  PROTEIN C, TOTAL  PROTEIN S, TOTAL  PROTEIN S ACTIVITY  PROTEIN C ACTIVITY  PROTHROMBIN GENE MUTATION  HEPARIN  LEVEL (UNFRACTIONATED)     MDM  Patient's lab work no significant leukocytosis.  No significant anemia.  Creatinine appears to be at baseline.  Troponin elevated today at 42.  Plan for CTA for further evaluate for pulmonary embolism.  Giving an aspirin .  Repeating EKG  Clinical Course as of 09/23/24 0326  Thu Sep 23, 2024  0127 Call from CT tech with concern for findings on CT scan.  Expediting read to radiologist.  On my evaluation concern for saddle pulmonary embolism.  Started on heparin .  Remained stable on room air.  Continues to be normotensive.  Given his saddle PE once radiology has read his study will reach out to hospitalist for admission and vascular surgery to discuss whether they would want to do any intervention. [SM]  0153 Message Dr. Jama with vascular surgery to discuss any further interventions. [SM]  9675 Dr. Cleatus states that she will reach out to vascular surgery, do not feel that he needs an emergent procedure overnight since he is stable. [SM]    Clinical Course User Index [SM] Punter Clotilda, MD      PROCEDURES:  Critical Care performed: yes  .Critical Care  Performed by: Punter Clotilda, MD Authorized by: Punter Clotilda, MD   Critical care provider statement:    Critical care time (minutes):  35   Critical care time was exclusive of:  Separately billable procedures and treating other patients  Critical care was necessary to treat or prevent imminent or life-threatening deterioration of the following conditions:  Circulatory failure   Critical care was time spent personally by me on the following activities:  Development of treatment plan with patient or surrogate, discussions with consultants, evaluation of patient's response to treatment, examination of patient, ordering and review of laboratory studies, ordering and review of radiographic studies, ordering and performing treatments and interventions, pulse oximetry, re-evaluation of patient's condition and review of old charts .Ultrasound ED Peripheral IV (Provider)  Date/Time: 09/23/2024 2:05 AM  Performed by: Suzanne Kirsch, MD Authorized by: Suzanne Kirsch, MD   Procedure details:    Indications: multiple failed IV attempts     Skin Prep: chlorhexidine  gluconate     Location:  Left anterior forearm   Angiocath:  18 G   Bedside Ultrasound Guided: Yes     Images: not archived     Patient tolerated procedure without complications: Yes     Dressing applied: Yes   .Ultrasound ED Peripheral IV (Provider)  Date/Time: 09/23/2024 2:05 AM  Performed by: Suzanne Kirsch, MD Authorized by: Suzanne Kirsch, MD   Procedure details:    Indications: multiple failed IV attempts     Skin Prep: chlorhexidine  gluconate     Location:  Right forearm   Angiocath:  20 G   Bedside Ultrasound Guided: Yes     Images: not archived     Patient tolerated procedure without complications: Yes     Dressing applied: Yes     Patient's presentation is most consistent with acute presentation with potential threat to life or bodily  function.   MEDICATIONS ORDERED IN ED: Medications  heparin  ADULT infusion 100 units/mL (25000 units/250mL) (2,150 Units/hr Intravenous New Bag/Given 09/23/24 0207)  aspirin  chewable tablet 324 mg (324 mg Oral Given 09/22/24 2335)  iohexol  (OMNIPAQUE ) 350 MG/ML injection 100 mL (100 mLs Intravenous Contrast Given 09/23/24 0112)  heparin  bolus via infusion 5,000 Units (5,000 Units Intravenous Bolus from Bag 09/23/24 0207)    FINAL CLINICAL IMPRESSION(S) / ED DIAGNOSES   Final diagnoses:  Acute saddle pulmonary embolism, unspecified whether acute cor pulmonale present (HCC)     Rx / DC Orders   ED Discharge Orders     None        Note:  This document was prepared using Dragon voice recognition software and may include unintentional dictation errors.   Suzanne Kirsch, MD 09/23/24 9870    Suzanne Kirsch, MD 09/23/24 9845    Suzanne Kirsch, MD 09/23/24 9793    Suzanne Kirsch, MD 09/23/24 [phone]

## 2024-09-23 ENCOUNTER — Encounter: Payer: Self-pay | Admitting: Vascular Surgery

## 2024-09-23 ENCOUNTER — Emergency Department

## 2024-09-23 ENCOUNTER — Other Ambulatory Visit: Payer: Self-pay

## 2024-09-23 ENCOUNTER — Encounter: Admission: EM | Disposition: A | Payer: Self-pay | Source: Home / Self Care | Attending: Hospitalist

## 2024-09-23 ENCOUNTER — Inpatient Hospital Stay

## 2024-09-23 DIAGNOSIS — I2699 Other pulmonary embolism without acute cor pulmonale: Secondary | ICD-10-CM | POA: Diagnosis not present

## 2024-09-23 DIAGNOSIS — R7989 Other specified abnormal findings of blood chemistry: Secondary | ICD-10-CM | POA: Diagnosis present

## 2024-09-23 DIAGNOSIS — E119 Type 2 diabetes mellitus without complications: Secondary | ICD-10-CM | POA: Diagnosis present

## 2024-09-23 DIAGNOSIS — R0902 Hypoxemia: Secondary | ICD-10-CM | POA: Diagnosis present

## 2024-09-23 DIAGNOSIS — I2692 Saddle embolus of pulmonary artery without acute cor pulmonale: Secondary | ICD-10-CM | POA: Diagnosis present

## 2024-09-23 DIAGNOSIS — Z79899 Other long term (current) drug therapy: Secondary | ICD-10-CM | POA: Diagnosis not present

## 2024-09-23 DIAGNOSIS — I10 Essential (primary) hypertension: Secondary | ICD-10-CM | POA: Diagnosis present

## 2024-09-23 DIAGNOSIS — Z7901 Long term (current) use of anticoagulants: Secondary | ICD-10-CM | POA: Diagnosis not present

## 2024-09-23 DIAGNOSIS — I824Z2 Acute embolism and thrombosis of unspecified deep veins of left distal lower extremity: Secondary | ICD-10-CM | POA: Diagnosis not present

## 2024-09-23 DIAGNOSIS — I272 Pulmonary hypertension, unspecified: Secondary | ICD-10-CM | POA: Diagnosis present

## 2024-09-23 DIAGNOSIS — Z8249 Family history of ischemic heart disease and other diseases of the circulatory system: Secondary | ICD-10-CM | POA: Diagnosis not present

## 2024-09-23 DIAGNOSIS — I50810 Right heart failure, unspecified: Secondary | ICD-10-CM | POA: Diagnosis not present

## 2024-09-23 DIAGNOSIS — I890 Lymphedema, not elsewhere classified: Secondary | ICD-10-CM | POA: Diagnosis present

## 2024-09-23 DIAGNOSIS — E66813 Obesity, class 3: Secondary | ICD-10-CM

## 2024-09-23 DIAGNOSIS — E785 Hyperlipidemia, unspecified: Secondary | ICD-10-CM | POA: Diagnosis present

## 2024-09-23 DIAGNOSIS — I82412 Acute embolism and thrombosis of left femoral vein: Secondary | ICD-10-CM | POA: Diagnosis present

## 2024-09-23 DIAGNOSIS — I2489 Other forms of acute ischemic heart disease: Secondary | ICD-10-CM | POA: Diagnosis present

## 2024-09-23 DIAGNOSIS — Z6841 Body Mass Index (BMI) 40.0 and over, adult: Secondary | ICD-10-CM | POA: Diagnosis not present

## 2024-09-23 DIAGNOSIS — Z9889 Other specified postprocedural states: Secondary | ICD-10-CM | POA: Diagnosis not present

## 2024-09-23 DIAGNOSIS — Z86718 Personal history of other venous thrombosis and embolism: Secondary | ICD-10-CM | POA: Diagnosis not present

## 2024-09-23 HISTORY — PX: PULMONARY THROMBECTOMY: CATH118295

## 2024-09-23 LAB — PROTIME-INR
INR: 1.1 (ref 0.8–1.2)
Prothrombin Time: 14.5 s (ref 11.4–15.2)

## 2024-09-23 LAB — TROPONIN T, HIGH SENSITIVITY: Troponin T High Sensitivity: 28 ng/L — ABNORMAL HIGH (ref 0–19)

## 2024-09-23 LAB — MRSA NEXT GEN BY PCR, NASAL: MRSA by PCR Next Gen: NOT DETECTED

## 2024-09-23 LAB — HEPARIN LEVEL (UNFRACTIONATED)
Heparin Unfractionated: 0.33 [IU]/mL (ref 0.30–0.70)
Heparin Unfractionated: 0.21 [IU]/mL — ABNORMAL LOW (ref 0.30–0.70)

## 2024-09-23 LAB — ANTITHROMBIN III: AntiThromb III Func: 93 % (ref 75–120)

## 2024-09-23 LAB — GLUCOSE, CAPILLARY: Glucose-Capillary: 98 mg/dL (ref 70–99)

## 2024-09-23 LAB — APTT: aPTT: 43 s — ABNORMAL HIGH (ref 24–36)

## 2024-09-23 SURGERY — PULMONARY THROMBECTOMY
Anesthesia: Moderate Sedation

## 2024-09-23 MED ORDER — IOHEXOL 350 MG/ML SOLN
100.0000 mL | Freq: Once | INTRAVENOUS | Status: AC | PRN
  Administered 2024-09-23: 100 mL via INTRAVENOUS

## 2024-09-23 MED ORDER — OXYCODONE HCL 5 MG PO TABS
5.0000 mg | ORAL_TABLET | ORAL | Status: DC | PRN
  Administered 2024-09-24 – 2024-09-25 (×4): 5 mg via ORAL
  Filled 2024-09-23 (×4): qty 1

## 2024-09-23 MED ORDER — SODIUM CHLORIDE 0.9 % IV SOLN: INTRAVENOUS | Status: DC

## 2024-09-23 MED ORDER — HEPARIN (PORCINE) 25000 UT/250ML-% IV SOLN
2400.0000 [IU]/h | INTRAVENOUS | Status: DC
  Administered 2024-09-23: 2400 [IU]/h via INTRAVENOUS
  Filled 2024-09-23 (×2): qty 250

## 2024-09-23 MED ORDER — HEPARIN SODIUM (PORCINE) 1000 UNIT/ML IJ SOLN
INTRAMUSCULAR | Status: DC | PRN
  Administered 2024-09-23: 4000 [IU] via INTRAVENOUS

## 2024-09-23 MED ORDER — PANTOPRAZOLE SODIUM 40 MG PO TBEC
40.0000 mg | DELAYED_RELEASE_TABLET | Freq: Every day | ORAL | Status: DC
  Administered 2024-09-23 – 2024-09-26 (×4): 40 mg via ORAL
  Filled 2024-09-23 (×4): qty 1

## 2024-09-23 MED ORDER — ALTEPLASE 1 MG/ML SYRINGE FOR VASCULAR PROCEDURE
INTRAMUSCULAR | Status: DC | PRN
  Administered 2024-09-23: 5 mg via INTRA_ARTERIAL

## 2024-09-23 MED ORDER — ALTEPLASE 2 MG IJ SOLR
INTRAMUSCULAR | Status: AC
  Filled 2024-09-23: qty 10

## 2024-09-23 MED ORDER — IODIXANOL 320 MG/ML IV SOLN
INTRAVENOUS | Status: DC | PRN
  Administered 2024-09-23: 60 mL

## 2024-09-23 MED ORDER — LIDOCAINE HCL (PF) 1 % IJ SOLN
INTRAMUSCULAR | Status: DC | PRN
  Administered 2024-09-23: 10 mL

## 2024-09-23 MED ORDER — HEPARIN (PORCINE) 25000 UT/250ML-% IV SOLN
2150.0000 [IU]/h | INTRAVENOUS | Status: DC
  Administered 2024-09-23 (×2): 2150 [IU]/h via INTRAVENOUS
  Filled 2024-09-23: qty 250

## 2024-09-23 MED ORDER — CHLORHEXIDINE GLUCONATE CLOTH 2 % EX PADS
6.0000 | MEDICATED_PAD | Freq: Every day | CUTANEOUS | Status: DC
  Administered 2024-09-23: 6 via TOPICAL
  Filled 2024-09-23: qty 6

## 2024-09-23 MED ORDER — FENTANYL CITRATE (PF) 100 MCG/2ML IJ SOLN
INTRAMUSCULAR | Status: AC
  Filled 2024-09-23: qty 2

## 2024-09-23 MED ORDER — HEPARIN BOLUS VIA INFUSION
2000.0000 [IU] | Freq: Once | INTRAVENOUS | Status: AC
  Administered 2024-09-23: 2000 [IU] via INTRAVENOUS
  Filled 2024-09-23: qty 2000

## 2024-09-23 MED ORDER — CEFAZOLIN SODIUM-DEXTROSE 2-4 GM/100ML-% IV SOLN
INTRAVENOUS | Status: AC
  Filled 2024-09-23: qty 100

## 2024-09-23 MED ORDER — MIDAZOLAM HCL 5 MG/5ML IJ SOLN
INTRAMUSCULAR | Status: AC
  Filled 2024-09-23: qty 5

## 2024-09-23 MED ORDER — FENTANYL CITRATE (PF) 100 MCG/2ML IJ SOLN
INTRAMUSCULAR | Status: DC | PRN
  Administered 2024-09-23 (×3): 25 ug via INTRAVENOUS

## 2024-09-23 MED ORDER — HEPARIN SODIUM (PORCINE) 1000 UNIT/ML IJ SOLN
INTRAMUSCULAR | Status: AC
  Filled 2024-09-23: qty 10

## 2024-09-23 MED ORDER — HEPARIN (PORCINE) IN NACL 1000-0.9 UT/500ML-% IV SOLN
INTRAVENOUS | Status: DC | PRN
  Administered 2024-09-23: 1000 mL

## 2024-09-23 MED ORDER — MORPHINE SULFATE (PF) 2 MG/ML IV SOLN
2.0000 mg | Freq: Once | INTRAVENOUS | Status: AC
  Administered 2024-09-23: 2 mg via INTRAVENOUS
  Filled 2024-09-23: qty 1

## 2024-09-23 MED ORDER — HEPARIN (PORCINE) 25000 UT/250ML-% IV SOLN
INTRAVENOUS | Status: AC
  Filled 2024-09-23: qty 250

## 2024-09-23 MED ORDER — MORPHINE SULFATE (PF) 2 MG/ML IV SOLN
1.0000 mg | INTRAVENOUS | Status: DC | PRN
  Administered 2024-09-23 – 2024-09-25 (×6): 1 mg via INTRAVENOUS
  Filled 2024-09-23 (×6): qty 1

## 2024-09-23 MED ORDER — MIDAZOLAM HCL (PF) 2 MG/2ML IJ SOLN
INTRAMUSCULAR | Status: DC | PRN
  Administered 2024-09-23 (×3): 1 mg via INTRAVENOUS

## 2024-09-23 MED ORDER — ACETAMINOPHEN 325 MG PO TABS
650.0000 mg | ORAL_TABLET | Freq: Four times a day (QID) | ORAL | Status: DC | PRN
  Administered 2024-09-25 (×2): 650 mg via ORAL
  Filled 2024-09-23 (×2): qty 2

## 2024-09-23 MED ORDER — CEFAZOLIN SODIUM-DEXTROSE 2-4 GM/100ML-% IV SOLN
2.0000 g | INTRAVENOUS | Status: AC
  Administered 2024-09-23: 2 g via INTRAVENOUS

## 2024-09-23 MED ORDER — HEPARIN BOLUS VIA INFUSION
5000.0000 [IU] | Freq: Once | INTRAVENOUS | Status: AC
  Administered 2024-09-23: 5000 [IU] via INTRAVENOUS
  Filled 2024-09-23: qty 5000

## 2024-09-23 SURGICAL SUPPLY — 22 items
CANISTER PENUMBRA ENGINE (MISCELLANEOUS) IMPLANT
CATH ANGIO 5F PIGTAIL 100CM (CATHETERS) IMPLANT
CATH INDIGO SEP 8 (CATHETERS) IMPLANT
CATH LIGHTNING 8 XTORQ 115 (CATHETERS) IMPLANT
CATH SELECT BERN TIP 5F 130 (CATHETERS) IMPLANT
CLOSURE PERCLOSE PROSTYLE (Vascular Products) IMPLANT
COVER PROBE ULTRASOUND 5X96 (MISCELLANEOUS) IMPLANT
DEVICE TORQUE (MISCELLANEOUS) IMPLANT
GLIDEWIRE ANGLED SS 035X260CM (WIRE) IMPLANT
NDL ENTRY 21GA 7CM ECHOTIP (NEEDLE) IMPLANT
NEEDLE ENTRY 21GA 7CM ECHOTIP (NEEDLE) IMPLANT
PACK ANGIOGRAPHY (CUSTOM PROCEDURE TRAY) ×1 IMPLANT
PANNUS RETENTION SYSTEM 2 PAD (MISCELLANEOUS) IMPLANT
SET INTRO CAPELLA COAXIAL (SET/KITS/TRAYS/PACK) IMPLANT
SHEATH 9FRX11 (SHEATH) IMPLANT
SHEATH BRITE TIP 6FRX11 (SHEATH) IMPLANT
SUT MNCRL AB 4-0 PS2 18 (SUTURE) IMPLANT
SUT SILK 0 FSL (SUTURE) IMPLANT
SYR MEDRAD MARK 7 150ML (SYRINGE) IMPLANT
TUBING CONTRAST HIGH PRESS 72 (TUBING) IMPLANT
WIRE AMPLATZ SSTIFF .035X260CM (WIRE) IMPLANT
WIRE J 3MM .035X145CM (WIRE) IMPLANT

## 2024-09-23 NOTE — Progress Notes (Signed)
 Progress Note   Patient: Todd Ward FMW:[id number] DOB: [birth year] DOA: 09/22/2024     0 DOS: the patient was seen and examined on 09/23/2024   Brief hospital course: Mr. Brisby was admitted to the hospital with the working diagnosis of submassive pulmonary embolism.   23 yo male with the past medical history of obesity class 3, and history of right lower extremity deep vein thrombosis post knee surgery in 2022, who presented with chest pain.  He reported acute onset of chest pain, that was associated with dyspnea, fatigue and lightheadedness, worse with exertion. Because severity of symptoms he came to the ED for further evaluation. On his initial physical examination his blood pressure was 145/95, HR 107 RR 22 and 02 saturation 98% Lungs with no wheezing or rhonchi, no rales, heart with S1 and S2 present and tachycardic, abdomen protuberant, but not tender or distended, no lower extremity edema, positive some component of  lymphedema.   Na 141, K 4,0 Cl 105 bicarbonate 24 glucose 81 bun 14 cr 0,86  High sensitive troponin 42 and 28  Wbc 10,0 hgb 15.3 plt 208    Chest radiograph with hypoinflation, positive cardiomegaly, bilateral hilar vascular congestion with no effusions or infiltrates.   EKG 102 bpm, normal axis, normal intervals, qtc 411, sinus rhythm, with SV1, q3T3 pattern, no significant ST segment changes.   CT chest large acute pulmonary embolic burden without evidence of right heart strain, central pulmonary arteries are enlarged.  Mid basilar ground glass opacities, right greater than left.   Lower extremity US , with positive subtotal occlusive venous thrombosis in the left lower extremity extending from the mid femoral vein into the calf veins.  No evidence of deep venous thrombosis on the right lower extremity.   Patient was placed on IV heparin  for anticoagulation.  12/04 thrombolysis bilateral pulmonary arteries with 10 mg of TPA.  Mechanical thrombectomy bilateral  lobar pulmonary arteries for removal of pulmonary emboli,  Selective catheter placement right upper lobe pulmonary artery, middle lobe pulmonary artery, and lower lobe pulmonary artery. Selective catheter placement left upper lobe pulmonary artery and lower lobe pulmonary artery.   Assessment and Plan: * Large pulmonary embolism, acute (HCC) Submassive pulmonary embolism, possible unprovoked.   Sp TPC, plus thrombectomy to the pulmonary arteries.  Plan to continue anticoagulation with heparin  IV, when stable plan to start patient on rivaroxabam, he likely will need lifelong anticoagulation.   Check echocardiogram.  Continue blood pressure monitoring in stepdown unit.  Add morphine , oxycodone  and acetaminophen  for pain control.  Add pantoprazole during his hospitalization for GI prophylaxis.   DVT, lower extremity, distal, acute, left (HCC) Large proximal left lower extremity deep vein thrombosis  Continue anticoagulation Follow up with vascular recommendations, possible thrombectomy.   Obesity, class 3 (HCC) Calculated BMI is 58,3      Subjective: patient post procedure having mild chest pain, no dyspnea at rest.  No palpitations   Physical Exam: Vitals:   09/23/24 1345 09/23/24 1400 09/23/24 1405 09/23/24 1500  BP: (!) 134/90 (!) 143/94  (!) 145/93  Pulse: 80 77 75 73  Resp: 18 (!) 23 (!) 26 (!) 36  Temp:  97.7 F (36.5 C)  98.1 F (36.7 C)  TempSrc:  Temporal  Oral  SpO2: 95% 95% 96% 98%  Weight:      Height:       Neurology awake and alert ENT with no pallor or icterus Cardiovascular with S1 and S2 present and regular with no gallops, rubs or  murmurs Respiratory no wheezing or rhonchi, poor inspiratory effort, on anterior auscultation  Abdomen soft and non tender, not distended No lower extremity edema  Data Reviewed:    Family Communication: I spoke with patient's parents  at the bedside, we talked in detail about patient's condition, plan of care and  prognosis and all questions were addressed.   Disposition: Status is: Inpatient Remains inpatient appropriate because: IV anticoagulation   Planned Discharge Destination: Home     Author: Elidia Toribio Furnace, MD 09/23/2024 3:32 PM  For on call review [email or web address].

## 2024-09-23 NOTE — Plan of Care (Signed)
  Problem: Education: Goal: Knowledge of General Education information will improve Description: Including pain rating scale, medication(s)/side effects and non-pharmacologic comfort measures Outcome: Progressing   Problem: Health Behavior/Discharge Planning: Goal: Ability to manage health-related needs will improve Outcome: Progressing   Problem: Clinical Measurements: Goal: Respiratory complications will improve Outcome: Progressing Goal: Cardiovascular complication will be avoided Outcome: Progressing   Problem: Coping: Goal: Level of anxiety will decrease Outcome: Progressing   Problem: Elimination: Goal: Will not experience complications related to urinary retention Outcome: Progressing   Problem: Pain Managment: Goal: General experience of comfort will improve and/or be controlled Outcome: Progressing

## 2024-09-23 NOTE — Assessment & Plan Note (Signed)
 Large proximal left lower extremity deep vein thrombosis  Continue anticoagulation Follow up with vascular recommendations, possible thrombectomy.

## 2024-09-23 NOTE — ED Notes (Signed)
 Pt hooked up to all vital monitoring.

## 2024-09-23 NOTE — Assessment & Plan Note (Addendum)
 Calculated BMI is 58,3

## 2024-09-23 NOTE — H&P (View-Only) (Signed)
 Hospital Consult    Reason for Consult:  Pulmonary Embolism  Requesting Physician:  Dr Delayne Solian MD  MRN #:  [id number]  History of Present Illness: This is a 23 y.o. male with medical history significant for Morbid obesity, provoked DVT left lower extremity (02/2021) following knee surgery, completing 6 months of anticoagulation with Xarelto, being admitted with a large embolic PE without RV strain.  Patient presented with intermittent chest pain that started on awakening on 09/22/2024.  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  He resting a bit, with improvement in symptoms but once he started walking again the symptoms recurred and he felt like he was going to pass out, prompting the visit to the ED.  He denies pain or swelling in the lower extremities. Vascular Surgery consulted to evaluate.   Past Medical History:  Diagnosis Date   Diabetes mellitus without complication (HCC)    No meds-Last A1C in 2020 was 5.4   Elevated liver enzymes    Family history of adverse reaction to anesthesia    PTS [facility]-HARD TIME TO WAKE UP   Hyperlipidemia    Hypertension    off meds x 3-4 years   Morbid obesity (HCC)    Normocytic normochromic anemia    Obesity     Past Surgical History:  Procedure Laterality Date   KNEE ARTHROSCOPY Left 02/16/2016   Procedure: ARTHROSCOPY KNEE, LATERAL RELEASE, LOOSE BODY REMOVAL;  Surgeon: Ozell Flake, MD;  Location: [facility];  Service: Orthopedics;  Laterality: Left;   KNEE ARTHROSCOPY WITH MEDIAL PATELLAR FEMORAL LIGAMENT RECONSTRUCTION Left 02/19/2021   Procedure: Left knee arthroscopy and chondroplasty with loose body removal, MPFL reconstruction using allograft, and tibial tubercle osteotomy - RNFA needed;  Surgeon: Tobie Priest, MD;  Location: [facility];  Service:  Orthopedics;  Laterality: Left;  RNFA needed    No Known Allergies  Prior to Admission medications   Medication Sig Start Date End Date Taking? Authorizing Provider  amoxicillin (AMOXIL) 500 MG tablet Take 500 mg by mouth 2 (two) times daily. 09/20/24 09/30/24 Yes [provider]  menthol (CEPACOL) 3 MG lozenge Take 1 lozenge by mouth as needed for sore throat.   Yes [provider]  albuterol  (VENTOLIN  HFA) 108 (90 Base) MCG/ACT inhaler Inhale 2 puffs into the lungs every 6 (six) hours as needed for wheezing or shortness of breath. Patient not taking: Reported on 09/23/2024 01/29/21   Crawford Morna Pickle, NP  Melatonin 2.5 MG CHEW Chew by mouth. Patient not taking: Reported on 09/23/2024    [provider]  methocarbamol  (ROBAXIN ) 500 MG tablet Take 1 tablet (500 mg total) by mouth every 6 (six) hours as needed for up to 20 doses for muscle spasms. Patient not taking: Reported on 09/23/2024 02/20/21   Verlinda Boas, PA-C  Multiple Vitamins-Minerals (MULTIVITAMIN WITH MINERALS) tablet Take 1 tablet by mouth daily. Patient not taking: Reported on 09/23/2024    [provider]  ondansetron  (ZOFRAN ) 4 MG tablet Take 1 tablet (4 mg total) by mouth every 6 (six) hours as needed for nausea. Patient not taking: Reported on 09/23/2024 02/20/21   Verlinda Boas, PA-C  oxyCODONE  (OXY IR/ROXICODONE ) 5 MG immediate release tablet Take 1-2 tablets (5-10 mg total) by mouth every 4 (four) hours as needed for moderate pain (pain score 4-6). Patient  not taking: Reported on 09/23/2024 02/20/21   Verlinda Boas, PA-C  polyethylene glycol (MIRALAX  / GLYCOLAX ) 17 g packet Take 34 g by mouth 2 (two) times daily as needed for moderate constipation. Patient not taking: Reported on 09/23/2024 03/05/21   Awanda City, MD  traMADol  (ULTRAM ) 50 MG tablet Take 1 tablet (50 mg total) by mouth every 6 (six) hours as needed for moderate pain. Patient not taking: Reported on 09/23/2024 02/20/21   Verlinda Boas, PA-C     Social History   Socioeconomic History   Marital status: Single    Spouse name: Not on file   Number of children: Not on file   Years of education: Not on file   Highest education level: Not on file  Occupational History   Not on file  Tobacco Use   Smoking status: Never   Smokeless tobacco: Never  Vaping Use   Vaping status: Never Used  Substance and Sexual Activity   Alcohol use: No   Drug use: No   Sexual activity: Not on file  Other Topics Concern   Not on file  Social History Narrative   Not on file   Social Drivers of Health   Financial Resource Strain: Low Risk  (11/13/2023)   Received from [facility]   Overall Financial Resource Strain (CARDIA)    Difficulty of Paying Living Expenses: Not hard at all  Food Insecurity: No Food Insecurity (11/13/2023)   Received from [facility]   Hunger Vital Sign    Within the past 12 months, you worried that your food would run out before you got the money to buy more.: Never true    Within the past 12 months, the food you bought just didn't last and you didn't have money to get more.: Never true  Transportation Needs: No Transportation Needs (11/13/2023)   Received from [facility]   PRAPARE - Transportation    Lack of Transportation (Non-Medical): No    In the past 12 months, has lack of transportation kept you from medical appointments or from getting medications?: No  Physical Activity: Not on file  Stress: Not on file  Social Connections: Unknown (03/05/2022)   Received from [facility]   Social Network    Social Network: Not on file  Intimate Partner Violence: Unknown (01/25/2022)   Received from [facility]   HITS    Physically Hurt: Not on file    Insult or Talk Down To: Not on file    Threaten Physical Harm: Not on file    Scream or Curse: Not on file     Family History  Problem Relation Age of Onset   Hypertension Mother     ROS: Otherwise  negative unless mentioned in HPI  Physical Examination  Vitals:   09/23/24 0515 09/23/24 0628  BP:  129/84  Pulse: 70 71  Resp: 20 20  Temp:  98.7 F (37.1 C)  SpO2:  97%   Body mass index is 58.37 kg/m.  General:  WDWN in NAD Gait: Not observed HENT: WNL, normocephalic Pulmonary: normal non-labored breathing, without Rales, rhonchi,  wheezing Cardiac: regular, without  Murmurs, rubs or gallops; without carotid bruits Abdomen: Morbid Obesity, positive bowel sounds throughout, soft, NT/ND, no masses Skin: without rashes Vascular Exam/Pulses: All extremities are warm to touch with palpable pulses.  Extremities: without ischemic changes, without Gangrene , without cellulitis; without open wounds;  Musculoskeletal: no muscle wasting or atrophy  Neurologic: A&O X 3;  No focal weakness or paresthesias are detected; speech is fluent/normal Psychiatric:  The pt has Normal affect. Lymph:  Unremarkable  CBC    Component Value Date/Time   WBC 10.0 09/22/2024 2156   RBC 5.51 09/22/2024 2156   HGB 15.3 09/22/2024 2156   HCT 45.8 09/22/2024 2156   PLT 208 09/22/2024 2156   MCV 83.1 09/22/2024 2156   MCH 27.8 09/22/2024 2156   MCHC 33.4 09/22/2024 2156   RDW 12.7 09/22/2024 2156    BMET    Component Value Date/Time   NA 141 09/22/2024 2156   K 4.0 09/22/2024 2156   CL 105 09/22/2024 2156   CO2 24 09/22/2024 2156   GLUCOSE 81 09/22/2024 2156   BUN 14 09/22/2024 2156   CREATININE 0.86 09/22/2024 2156   CALCIUM 9.7 09/22/2024 2156   GFRNONAA >60 09/22/2024 2156    COAGS: Lab Results  Component Value Date   INR 1.1 09/23/2024     Non-Invasive Vascular Imaging:   EXAM: CTA of the Chest with contrast for PE 09/23/2024 01:18:38 AM   TECHNIQUE: CTA of the chest was performed with and without the administration of 100 mL of iohexol  (OMNIPAQUE ) 350 MG/ML intravenous contrast. Multiplanar reformatted images are provided for review. MIP images are provided for review.  Automated exposure control, iterative reconstruction, and/or weight based adjustment of the mA/kV was utilized to reduce the radiation dose to as low as reasonably achievable.   COMPARISON: Findings are compared to 03/01/2021.   CLINICAL HISTORY: Pulmonary embolism (PE) suspected, high prob.   FINDINGS:   PULMONARY ARTERIES: Pulmonary arteries are adequately opacified for evaluation. Subtle pulmonary embolus. Large acute embolic burden. Central pulmonary arteries are enlarged in keeping with changes of pulmonary arterial hypertension. There is, however, no CT evidence of right heart strain.   MEDIASTINUM: The heart and pericardium demonstrate no acute abnormality. Cardiac size is within normal limits. No pericardial effusion. The thoracic aorta is unremarkable.   LYMPH NODES: No mediastinal, hilar or axillary lymphadenopathy.   LUNGS AND PLEURA: Mild bibasilar ground-glass pulmonary infiltrate is present, asymmetrically more severe within the right lower lobe, possible reflecting changes of alveolar pulmonary edema or early exudate in the setting of pulmonary infarct. Peripherally, areas of consolidation are noted within the costophrenic sulcus bilaterally. 4 mm calcified pulmonary nodule within the right middle lobe, image 68/5. No pneumothorax or pleural effusion. No central obstructing lesion.   UPPER ABDOMEN: Limited images of the upper abdomen are unremarkable.   SOFT TISSUES AND BONES: No acute bone or soft tissue abnormality.   The critical findings of this study were discussed directly with Dr. Suzanne by myself at 1:30 am.   IMPRESSION: 1. Large acute pulmonary embolic burden without CT evidence of right heart strain; central pulmonary arteries are enlarged, consistent with pulmonary arterial hypertension. 2. Mild bibasilar ground-glass pulmonary infiltrates, right greater than left, possibly reflecting alveolar pulmonary edema or early exudate in the setting  of pulmonary infarct, with peripheral consolidations in the costophrenic sulci bilaterally. 3. The critical findings of this study were discussed directly with Dr. Suzanne by myself at 1:30 am.  EXAM:09/23/2024 ULTRASOUND DUPLEX OF THE BILATERAL LOWER EXTREMITY VEINS   TECHNIQUE: Duplex ultrasound using B-mode/gray scaled imaging and Doppler spectral analysis and color flow was obtained of the deep venous structures of the bilateral lower extremity.   COMPARISON: Left lower extremity doppler ultrasound 08/22/2021.   CLINICAL HISTORY: 23 year old male with history of pulmonary embolism (HCC).   FINDINGS:   RIGHT: The common  femoral vein, femoral vein, popliteal vein, and visible calf veins of the right lower extremity demonstrate normal compressibility with normal color flow and spectral analysis.   LEFT: Maintained compressibility, patency with color and spectral doppler flow in the left common femoral vein, junction with the greater saphenous vein, and profunda femoral vein. The left proximal femoral vein remains patent but the mid femoral vein is incompressible with hypoechoic thrombus (image 43) and diminished doppler flow. This clot appears nonocclusive. Distal femoral vein thrombus appears more occlusive with little residual doppler flow (images 47 and 48). The popliteal vein is distended with thrombus. Mild residual doppler flow. Visible left calf veins with similar echogenic thrombus and minimal flow.   IMPRESSION: 1. Positive for subtotal occlusive deep venous thrombosis in the left lower extremity extending from the mid femoral vein into the calf veins. 2. No evidence of deep venous thrombosis in the right lower extremity.   Statin:  No. Beta Blocker:  No. Aspirin :  No. ACEI:  No. ARB:  No. CCB use:  No Other antiplatelets/anticoagulants:  No.    ASSESSMENT/PLAN: This is a 23 y.o. male who presents to [facility] emergency department with intermittent chest pain  that started on awakening on 09/22/2024.  His parents at the bedside today and report that he had chest pain and a sharp pain to the right side of his chest on Sunday.  He went to urgent care on Monday morning.  They placed him on amoxicillin.  He woke up yesterday with worsening symptoms.  Patient endorses over the past 3 to 4 days he has become very dyspneic and very short of breath upon exertion.  Patient has a history of left lower extremity deep venous thrombosis post knee surgery for which she was on Xarelto for 6 months.  Upon workup patient underwent bilateral lower extremity ultrasounds for DVT.  He is positive for subtotal occlusive deep vein thrombosis in the left lower extremity extending from his mid femoral vein to the calf veins.  No DVT in his right lower extremity.  He also underwent a CTA of his chest which showed large acute pulmonary embolic burden.  There is no right heart strain noted however he does have dilated pulmonary vessels significant and related to pulmonary hypertension.  He is also noted to have ground glass pulmonary infiltrates right greater than left as a reflection of either pulmonary edema or pulmonary exudate due to pulmonary infarct.  On exam today is resting comfortably in the bed in ICU with his parents at the bedside.  Vitals are remained stable he remains on room air without any oxygen supplementation with saturations of 94 to 95%.  Vascular surgery recommends the patient undergo a pulmonary thrombectomy due to the large presentation of clot in the saddle of his pulmonary arteries.  I discussed in detail at the bedside this morning with the patient and his parents the procedure, benefits, risk, and complications.  They verbalized understanding wish to proceed.  I answered all their questions this morning.  She was told that he will be on lifelong anticoagulation now that this is the second lower extremity DVT with a pulmonary embolism.  Patient has been n.p.o. since  midnight last night.  Patient was started on heparin  infusion in the emergency room as well.  All blood work taken has been within normal limits.   -I discussed the case in detail with Dr. Cordella Shawl MD and he agrees with the plan.   [name] [facility] 09/23/2024  7:20 AM

## 2024-09-23 NOTE — Progress Notes (Signed)
 Pt returned back to ICU 5 at this time. Pt is alert and oriented. Fem access site WNL. Pt c/o 7/10 chest pain. Vascular and hospitalist MD aware. Orders received for IV morphine .

## 2024-09-23 NOTE — Consult Note (Signed)
 Hospital Consult    Reason for Consult:  Pulmonary Embolism  Requesting Physician:  Dr Delayne Solian MD  MRN #:  [id number]  History of Present Illness: This is a 23 y.o. male with medical history significant for Morbid obesity, provoked DVT left lower extremity (02/2021) following knee surgery, completing 6 months of anticoagulation with Xarelto, being admitted with a large embolic PE without RV strain.  Patient presented with intermittent chest pain that started on awakening on 09/22/2024.  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  He resting a bit, with improvement in symptoms but once he started walking again the symptoms recurred and he felt like he was going to pass out, prompting the visit to the ED.  He denies pain or swelling in the lower extremities. Vascular Surgery consulted to evaluate.   Past Medical History:  Diagnosis Date   Diabetes mellitus without complication (HCC)    No meds-Last A1C in 2020 was 5.4   Elevated liver enzymes    Family history of adverse reaction to anesthesia    PTS TWIN BROTHER-HARD TIME TO WAKE UP   Hyperlipidemia    Hypertension    off meds x 3-4 years   Morbid obesity (HCC)    Normocytic normochromic anemia    Obesity     Past Surgical History:  Procedure Laterality Date   KNEE ARTHROSCOPY Left 02/16/2016   Procedure: ARTHROSCOPY KNEE, LATERAL RELEASE, LOOSE BODY REMOVAL;  Surgeon: Ozell Flake, MD;  Location: [facility] ORS;  Service: Orthopedics;  Laterality: Left;   KNEE ARTHROSCOPY WITH MEDIAL PATELLAR FEMORAL LIGAMENT RECONSTRUCTION Left 02/19/2021   Procedure: Left knee arthroscopy and chondroplasty with loose body removal, MPFL reconstruction using allograft, and tibial tubercle osteotomy - RNFA needed;  Surgeon: Tobie Priest, MD;  Location: [facility] ORS;  Service:  Orthopedics;  Laterality: Left;  RNFA needed    No Known Allergies  Prior to Admission medications   Medication Sig Start Date End Date Taking? Authorizing Provider  amoxicillin (AMOXIL) 500 MG tablet Take 500 mg by mouth 2 (two) times daily. 09/20/24 09/30/24 Yes [provider]  menthol (CEPACOL) 3 MG lozenge Take 1 lozenge by mouth as needed for sore throat.   Yes [provider]  albuterol  (VENTOLIN  HFA) 108 (90 Base) MCG/ACT inhaler Inhale 2 puffs into the lungs every 6 (six) hours as needed for wheezing or shortness of breath. Patient not taking: Reported on 09/23/2024 01/29/21   Crawford Morna Pickle, NP  Melatonin 2.5 MG CHEW Chew by mouth. Patient not taking: Reported on 09/23/2024    [provider]  methocarbamol  (ROBAXIN ) 500 MG tablet Take 1 tablet (500 mg total) by mouth every 6 (six) hours as needed for up to 20 doses for muscle spasms. Patient not taking: Reported on 09/23/2024 02/20/21   Verlinda Boas, PA-C  Multiple Vitamins-Minerals (MULTIVITAMIN WITH MINERALS) tablet Take 1 tablet by mouth daily. Patient not taking: Reported on 09/23/2024    [provider]  ondansetron  (ZOFRAN ) 4 MG tablet Take 1 tablet (4 mg total) by mouth every 6 (six) hours as needed for nausea. Patient not taking: Reported on 09/23/2024 02/20/21   Verlinda Boas, PA-C  oxyCODONE  (OXY IR/ROXICODONE ) 5 MG immediate release tablet Take 1-2 tablets (5-10 mg total) by mouth every 4 (four) hours as needed for moderate pain (pain score 4-6). Patient  not taking: Reported on 09/23/2024 02/20/21   Verlinda Boas, PA-C  polyethylene glycol (MIRALAX  / GLYCOLAX ) 17 g packet Take 34 g by mouth 2 (two) times daily as needed for moderate constipation. Patient not taking: Reported on 09/23/2024 03/05/21   Awanda City, MD  traMADol  (ULTRAM ) 50 MG tablet Take 1 tablet (50 mg total) by mouth every 6 (six) hours as needed for moderate pain. Patient not taking: Reported on 09/23/2024 02/20/21   Verlinda Boas, PA-C     Social History   Socioeconomic History   Marital status: Single    Spouse name: Not on file   Number of children: Not on file   Years of education: Not on file   Highest education level: Not on file  Occupational History   Not on file  Tobacco Use   Smoking status: Never   Smokeless tobacco: Never  Vaping Use   Vaping status: Never Used  Substance and Sexual Activity   Alcohol use: No   Drug use: No   Sexual activity: Not on file  Other Topics Concern   Not on file  Social History Narrative   Not on file   Social Drivers of Health   Financial Resource Strain: Low Risk  (11/13/2023)   Received from [facility]   Overall Financial Resource Strain (CARDIA)    Difficulty of Paying Living Expenses: Not hard at all  Food Insecurity: No Food Insecurity (11/13/2023)   Received from [facility]   Hunger Vital Sign    Within the past 12 months, you worried that your food would run out before you got the money to buy more.: Never true    Within the past 12 months, the food you bought just didn't last and you didn't have money to get more.: Never true  Transportation Needs: No Transportation Needs (11/13/2023)   Received from [facility]   PRAPARE - Transportation    Lack of Transportation (Non-Medical): No    In the past 12 months, has lack of transportation kept you from medical appointments or from getting medications?: No  Physical Activity: Not on file  Stress: Not on file  Social Connections: Unknown (03/05/2022)   Received from [facility]   Social Network    Social Network: Not on file  Intimate Partner Violence: Unknown (01/25/2022)   Received from [facility]   HITS    Physically Hurt: Not on file    Insult or Talk Down To: Not on file    Threaten Physical Harm: Not on file    Scream or Curse: Not on file     Family History  Problem Relation Age of Onset   Hypertension Mother     ROS: Otherwise  negative unless mentioned in HPI  Physical Examination  Vitals:   09/23/24 0515 09/23/24 0628  BP:  129/84  Pulse: 70 71  Resp: 20 20  Temp:  98.7 F (37.1 C)  SpO2:  97%   Body mass index is 58.37 kg/m.  General:  WDWN in NAD Gait: Not observed HENT: WNL, normocephalic Pulmonary: normal non-labored breathing, without Rales, rhonchi,  wheezing Cardiac: regular, without  Murmurs, rubs or gallops; without carotid bruits Abdomen: Morbid Obesity, positive bowel sounds throughout, soft, NT/ND, no masses Skin: without rashes Vascular Exam/Pulses: All extremities are warm to touch with palpable pulses.  Extremities: without ischemic changes, without Gangrene , without cellulitis; without open wounds;  Musculoskeletal: no muscle wasting or atrophy  Neurologic: A&O X 3;  No focal weakness or paresthesias are detected; speech is fluent/normal Psychiatric:  The pt has Normal affect. Lymph:  Unremarkable  CBC    Component Value Date/Time   WBC 10.0 09/22/2024 2156   RBC 5.51 09/22/2024 2156   HGB 15.3 09/22/2024 2156   HCT 45.8 09/22/2024 2156   PLT 208 09/22/2024 2156   MCV 83.1 09/22/2024 2156   MCH 27.8 09/22/2024 2156   MCHC 33.4 09/22/2024 2156   RDW 12.7 09/22/2024 2156    BMET    Component Value Date/Time   NA 141 09/22/2024 2156   K 4.0 09/22/2024 2156   CL 105 09/22/2024 2156   CO2 24 09/22/2024 2156   GLUCOSE 81 09/22/2024 2156   BUN 14 09/22/2024 2156   CREATININE 0.86 09/22/2024 2156   CALCIUM 9.7 09/22/2024 2156   GFRNONAA >60 09/22/2024 2156    COAGS: Lab Results  Component Value Date   INR 1.1 09/23/2024     Non-Invasive Vascular Imaging:   EXAM: CTA of the Chest with contrast for PE 09/23/2024 01:18:38 AM   TECHNIQUE: CTA of the chest was performed with and without the administration of 100 mL of iohexol  (OMNIPAQUE ) 350 MG/ML intravenous contrast. Multiplanar reformatted images are provided for review. MIP images are provided for review.  Automated exposure control, iterative reconstruction, and/or weight based adjustment of the mA/kV was utilized to reduce the radiation dose to as low as reasonably achievable.   COMPARISON: Findings are compared to 03/01/2021.   CLINICAL HISTORY: Pulmonary embolism (PE) suspected, high prob.   FINDINGS:   PULMONARY ARTERIES: Pulmonary arteries are adequately opacified for evaluation. Subtle pulmonary embolus. Large acute embolic burden. Central pulmonary arteries are enlarged in keeping with changes of pulmonary arterial hypertension. There is, however, no CT evidence of right heart strain.   MEDIASTINUM: The heart and pericardium demonstrate no acute abnormality. Cardiac size is within normal limits. No pericardial effusion. The thoracic aorta is unremarkable.   LYMPH NODES: No mediastinal, hilar or axillary lymphadenopathy.   LUNGS AND PLEURA: Mild bibasilar ground-glass pulmonary infiltrate is present, asymmetrically more severe within the right lower lobe, possible reflecting changes of alveolar pulmonary edema or early exudate in the setting of pulmonary infarct. Peripherally, areas of consolidation are noted within the costophrenic sulcus bilaterally. 4 mm calcified pulmonary nodule within the right middle lobe, image 68/5. No pneumothorax or pleural effusion. No central obstructing lesion.   UPPER ABDOMEN: Limited images of the upper abdomen are unremarkable.   SOFT TISSUES AND BONES: No acute bone or soft tissue abnormality.   The critical findings of this study were discussed directly with Dr. Suzanne by myself at 1:30 am.   IMPRESSION: 1. Large acute pulmonary embolic burden without CT evidence of right heart strain; central pulmonary arteries are enlarged, consistent with pulmonary arterial hypertension. 2. Mild bibasilar ground-glass pulmonary infiltrates, right greater than left, possibly reflecting alveolar pulmonary edema or early exudate in the setting  of pulmonary infarct, with peripheral consolidations in the costophrenic sulci bilaterally. 3. The critical findings of this study were discussed directly with Dr. Suzanne by myself at 1:30 am.  EXAM:09/23/2024 ULTRASOUND DUPLEX OF THE BILATERAL LOWER EXTREMITY VEINS   TECHNIQUE: Duplex ultrasound using B-mode/gray scaled imaging and Doppler spectral analysis and color flow was obtained of the deep venous structures of the bilateral lower extremity.   COMPARISON: Left lower extremity doppler ultrasound 08/22/2021.   CLINICAL HISTORY: 23 year old male with history of pulmonary embolism (HCC).   FINDINGS:   RIGHT: The common  femoral vein, femoral vein, popliteal vein, and visible calf veins of the right lower extremity demonstrate normal compressibility with normal color flow and spectral analysis.   LEFT: Maintained compressibility, patency with color and spectral doppler flow in the left common femoral vein, junction with the greater saphenous vein, and profunda femoral vein. The left proximal femoral vein remains patent but the mid femoral vein is incompressible with hypoechoic thrombus (image 43) and diminished doppler flow. This clot appears nonocclusive. Distal femoral vein thrombus appears more occlusive with little residual doppler flow (images 47 and 48). The popliteal vein is distended with thrombus. Mild residual doppler flow. Visible left calf veins with similar echogenic thrombus and minimal flow.   IMPRESSION: 1. Positive for subtotal occlusive deep venous thrombosis in the left lower extremity extending from the mid femoral vein into the calf veins. 2. No evidence of deep venous thrombosis in the right lower extremity.   Statin:  No. Beta Blocker:  No. Aspirin :  No. ACEI:  No. ARB:  No. CCB use:  No Other antiplatelets/anticoagulants:  No.    ASSESSMENT/PLAN: This is a 23 y.o. male who presents to [facility] emergency department with intermittent chest pain  that started on awakening on 09/22/2024.  His parents at the bedside today and report that he had chest pain and a sharp pain to the right side of his chest on Sunday.  He went to urgent care on Monday morning.  They placed him on amoxicillin.  He woke up yesterday with worsening symptoms.  Patient endorses over the past 3 to 4 days he has become very dyspneic and very short of breath upon exertion.  Patient has a history of left lower extremity deep venous thrombosis post knee surgery for which she was on Xarelto for 6 months.  Upon workup patient underwent bilateral lower extremity ultrasounds for DVT.  He is positive for subtotal occlusive deep vein thrombosis in the left lower extremity extending from his mid femoral vein to the calf veins.  No DVT in his right lower extremity.  He also underwent a CTA of his chest which showed large acute pulmonary embolic burden.  There is no right heart strain noted however he does have dilated pulmonary vessels significant and related to pulmonary hypertension.  He is also noted to have ground glass pulmonary infiltrates right greater than left as a reflection of either pulmonary edema or pulmonary exudate due to pulmonary infarct.  On exam today is resting comfortably in the bed in ICU with his parents at the bedside.  Vitals are remained stable he remains on room air without any oxygen supplementation with saturations of 94 to 95%.  Vascular surgery recommends the patient undergo a pulmonary thrombectomy due to the large presentation of clot in the saddle of his pulmonary arteries.  I discussed in detail at the bedside this morning with the patient and his parents the procedure, benefits, risk, and complications.  They verbalized understanding wish to proceed.  I answered all their questions this morning.  She was told that he will be on lifelong anticoagulation now that this is the second lower extremity DVT with a pulmonary embolism.  Patient has been n.p.o. since  midnight last night.  Patient was started on heparin  infusion in the emergency room as well.  All blood work taken has been within normal limits.   -I discussed the case in detail with Dr. Cordella Shawl MD and he agrees with the plan.   [name] [facility] 09/23/2024  7:20 AM

## 2024-09-23 NOTE — Interval H&P Note (Signed)
 History and Physical Interval Note:  09/23/2024 12:18 PM  Todd Ward  has presented today for surgery, with the diagnosis of Pulmonary embolism.  The various methods of treatment have been discussed with the patient and family. After consideration of risks, benefits and other options for treatment, the patient has consented to  Procedure(s): PULMONARY THROMBECTOMY (N/A) as a surgical intervention.  The patient's history has been reviewed, patient examined, no change in status, stable for surgery.  I have reviewed the patient's chart and labs.  Questions were answered to the patient's satisfaction.     Cordella Shawl

## 2024-09-23 NOTE — Op Note (Signed)
 [facility]  Percutaneous Study/Intervention Procedural Note   Date of Surgery: 09/23/2024,1:35 PM  Surgeon:Dereon Corkery, Cordella MATSU   Pre-operative Diagnosis: Symptomatic pulmonary emboli with right heart strain and hypoxia  Post-operative diagnosis:  Same  Procedure(s) Performed:  1.  Contrast injection right heart and bilateral pulmonary arteries  2.  Thrombolysis bilateral pulmonary arteries with 10 mg of TPA  3.  Mechanical thrombectomy bilateral lobar pulmonary arteries for removal of pulmonary emboli using the Penumbra CAT 8 thrombectomy catheter.  4.  Selective catheter placement right upper lobe pulmonary artery, middle lobe pulmonary artery and lower lobe pulmonary artery  5.  Selective catheter placement left upper lobe pulmonary artery and lower lobe pulmonary artery    Anesthesia: Conscious sedation was administered under my direct supervision by the interventional radiology RN. IV Versed  plus fentanyl  were utilized. Continuous ECG, pulse oximetry and blood pressure was monitored throughout the entire procedure.  Versed  and fentanyl  were administered intravenously.  Conscious sedation was administered for a total of 52 minutes and 47 seconds.  Sheath: 9 French Pinnacle sheath right common femoral vein antegrade  Contrast: 60 cc   Fluoroscopy Time: 11.7 minutes  Indications:  Patient presents with pulmonary emboli. The patient is symptomatic with hypoxemia and dyspnea on exertion.  There is evidence of right heart strain on the CT angiogram. The patient is otherwise a good candidate for intervention and even the long-term benefits pulmonary angiography with thrombolysis is offered. The risks and benefits are reviewed long-term benefits are discussed. All questions are answered patient agrees to proceed.  Procedure:  Ellijah Leffel Ingleis a 23 y.o. male who was identified and appropriate procedural time out was performed.  The patient was then placed supine on  the table and prepped and draped in the usual sterile fashion.  Ultrasound was used to evaluate the right common femoral vein.  It was patent, as it was echolucent and compressible.  A digital ultrasound image was acquired for the permanent record.  A micropuncture needle was used to access the right common femoral vein under direct ultrasound guidance.  A microwire was then advanced under fluoroscopic guidance followed by micro-sheath.  A 0.035 J wire was advanced without resistance and a 5Fr sheath was placed.  Perclose devices were then used in a preclose fashion and then upsized to an 9 French sheath.    The wire and pigtail catheter were then negotiated into the right atrium and bolus injection of contrast was utilized to demonstrate the right ventricle and the pulmonary artery outflow.   4000 units of heparin  was then given and allowed to circulate.  TPA was reconstituted and delivered onto the table. A total of 10 milligrams of TPA was utilized.  5 mg was administered on the left side and 5 mg was administered on the right side. This was then allowed to dwell for 20-30 minutes.  The J-wire and pigtail catheter was advanced up to the right atrium where a bolus injection contrast was used to demonstrate the pulmonary artery outflow.  Stiff angled Glidewire was then exchanged for the J-wire and the pigtail catheter was used to select the pulmonary outflow track.  The right main pulmonary artery was evaluated first.  A select catheter was then advanced over the Amplatz wire into the distal right main pulmonary artery and hand-injection confirmed the thrombus.  5 mg mg of tPA was then injected directly into the thrombus within the right distal main pulmonary artery.  After an appropriate dwell time the Amplatz  wire was reintroduced through the select catheter and the select catheter removed.  The Penumbra Cat 8 extra torque catheter was then advanced into the thrombus in the right lower lobe pulmonary  artery.  Hand-injection contrast was used to verify positioning and evaluate the distal anatomy.  Mechanical aspiration was performed using the CAT 8 catheter and a separator.  After multiple passes the catheter was then repositioned into the right middle lobe pulmonary artery and again hand-injection contrast was performed to verify position and evaluate the distal anatomy.  Multiple passes were made using mechanical aspiration in association with a separator.  Lastly, using a combination of the separator catheter and Glidewire the CAT 8 device was negotiated into the right upper lobe pulmonary artery.  Hand-injection of contrast was used to verify positioning and evaluate the distal anatomy.  Multiple passes were then performed using the separator with the CAT 8 penumbra catheter.  Once there was free flow of blood from all 3 lobar arteries the catheter was repositioned to the right main pulmonary artery.  Hand-injection contrast was then used to give an assessment of the effectiveness of thrombectomy.  Satisfied with the thrombectomy on the right, I then used the penumbra CAT 8 device as well as a Glidewire and an angled catheter to select the left main pulmonary artery.  The catheter was then advanced into the left main pulmonary artery.  Then with the catheter in the left main pulmonary artery bolus injection contrast was utilized to demonstrate the thrombus as well as the segmental pulmonary artery vasculature. This demonstrated thrombus in the distal left main pulmonary artery extending into the left upper lobe artery as well as the left lower lobe artery and noting that it was near occlusive.  The catheter was then advanced out so that it was positioned within the thrombus and 5 mg milligrams of TPA were infused directly into the clot.  After an appropriate dwell time the Amplatz wire was reintroduced through the pigtail catheter and the pigtail catheter removed.  The Penumbra Cat 8 extra torque  catheter was then advanced into the thrombus in the left lower lobe pulmonary artery.  Hand-injection contrast was used to verify the positioning and evaluate the distal anatomy.  Mechanical aspiration was performed using the CAT 8 catheter and a separator.  After multiple passes the catheter was then repositioned into the left upper lobe pulmonary artery and again hand-injection contrast was performed to verify positioning and evaluate the distal anatomy.  Multiple passes were made using mechanical aspiration in association with a separator.  Once there was free flow of blood from both lobar arteries the catheter was repositioned to the left main pulmonary artery.  Hand-injection contrast was then performed to give an assessment of the left pulmonary vasculature and the effectiveness of thrombectomy.  After review these images the catheter and sheath were removed and pressure held. There were no immediate complications.    Findings:   Right heart imaging:  Right atrium and right ventricle and the pulmonary outflow tract appears normal  Right lung: The initial images of the right lung demonstrate thrombus within the distal right main pulmonary artery extending into the right upper, middle and lower lobar arteries.  There is thrombus extending into the segmental branches as well.  Following thrombectomy there appears to be near total resolution of the previously identified thrombus.  Left lung:  The initial images of the left lung demonstrate thrombus within the distal left main pulmonary artery extending into the left upper  and lower lobar arteries.  There is thrombus extending into the segmental branches as well.  Following thrombectomy there appears to be near total resolution of the previously identified thrombus.    Disposition: Patient was taken to the recovery room in stable condition having tolerated the procedure well.  Cordella Pancho Rushing 09/23/2024,1:35 PM

## 2024-09-23 NOTE — H&P (Addendum)
 History and Physical    Patient: Todd Ward FMW:[id number] DOB: 2001/05/09 DOA: 09/22/2024 DOS: the patient was seen and examined on 09/23/2024 PCP: Alla Amis, MD  Patient coming from: Home  Chief Complaint:  Chief Complaint  Patient presents with   Chest Pain    HPI: Todd Ward is a 23 y.o. male with medical history significant for Morbid obesity, provoked DVT left lower extremity (02/2021) following knee surgery, completing 6 months of anticoagulation with Xarelto, being admitted with a large embolic PE without RV strain.  Patient presented with intermittent chest pain that started on awakening on 09/22/2024.  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  He resting a bit, with improvement in symptoms but once he started walking again the symptoms recurred and he felt like he was going to pass out, prompting the visit to the ED.  He denies pain or swelling in the lower extremities. On arrival in the ED tachycardic to 107 and tachypneic to 22 with O2 sat 98% on room air. Labs notable for troponin of 28.  BMP and CBC WNL. CTA chest showed a large acute pulmonary embolic burden without CT evidence of heart strain--please see details of report Patient started on a heparin  infusion Admission requested.     Past Medical History:  Diagnosis Date   Diabetes mellitus without complication (HCC)    No meds-Last A1C in 2020 was 5.4   Elevated liver enzymes    Family history of adverse reaction to anesthesia    PTS TWIN BROTHER-HARD TIME TO WAKE UP   Hyperlipidemia    Hypertension    off meds x 3-4 years   Morbid obesity (HCC)    Normocytic normochromic anemia    Obesity    Past Surgical History:  Procedure Laterality Date   KNEE ARTHROSCOPY Left 02/16/2016   Procedure: ARTHROSCOPY KNEE, LATERAL  RELEASE, LOOSE BODY REMOVAL;  Surgeon: Ozell Flake, MD;  Location: [facility] ORS;  Service: Orthopedics;  Laterality: Left;   KNEE ARTHROSCOPY WITH MEDIAL PATELLAR FEMORAL LIGAMENT RECONSTRUCTION Left 02/19/2021   Procedure: Left knee arthroscopy and chondroplasty with loose body removal, MPFL reconstruction using allograft, and tibial tubercle osteotomy - RNFA needed;  Surgeon: Tobie Priest, MD;  Location: [facility] ORS;  Service: Orthopedics;  Laterality: Left;  RNFA needed   Social History:  reports that he has never smoked. He has never used smokeless tobacco. He reports that he does not drink alcohol and does not use drugs.  No Known Allergies  Family History  Problem Relation Age of Onset   Hypertension Mother     Prior to Admission medications   Medication Sig Start Date End Date Taking? Authorizing Provider  amoxicillin (AMOXIL) 500 MG tablet Take 500 mg by mouth 2 (two) times daily. 09/20/24 09/30/24 Yes [provider]  menthol (CEPACOL) 3 MG lozenge Take 1 lozenge by mouth as needed for sore throat.   Yes [provider]  albuterol  (VENTOLIN  HFA) 108 (90 Base) MCG/ACT inhaler Inhale 2 puffs into the lungs every 6 (six) hours as needed for wheezing or shortness of breath. Patient not taking: Reported on 09/23/2024 01/29/21   Crawford Morna Pickle, NP  Melatonin 2.5 MG CHEW Chew by mouth. Patient not taking: Reported on 09/23/2024    [provider]  methocarbamol  (ROBAXIN ) 500 MG tablet Take 1  tablet (500 mg total) by mouth every 6 (six) hours as needed for up to 20 doses for muscle spasms. Patient not taking: Reported on 09/23/2024 02/20/21   Verlinda Boas, PA-C  Multiple Vitamins-Minerals (MULTIVITAMIN WITH MINERALS) tablet Take 1 tablet by mouth daily. Patient not taking: Reported on 09/23/2024    [provider]  ondansetron  (ZOFRAN ) 4 MG tablet Take 1 tablet (4 mg total) by mouth every 6 (six) hours as needed for nausea. Patient not taking: Reported on  09/23/2024 02/20/21   Verlinda Boas, PA-C  oxyCODONE  (OXY IR/ROXICODONE ) 5 MG immediate release tablet Take 1-2 tablets (5-10 mg total) by mouth every 4 (four) hours as needed for moderate pain (pain score 4-6). Patient not taking: Reported on 09/23/2024 02/20/21   Verlinda Boas, PA-C  polyethylene glycol (MIRALAX  / GLYCOLAX ) 17 g packet Take 34 g by mouth 2 (two) times daily as needed for moderate constipation. Patient not taking: Reported on 09/23/2024 03/05/21   Awanda City, MD  traMADol  (ULTRAM ) 50 MG tablet Take 1 tablet (50 mg total) by mouth every 6 (six) hours as needed for moderate pain. Patient not taking: Reported on 09/23/2024 02/20/21   Verlinda Boas, PA-C    Physical Exam: Vitals:   09/22/24 2148 09/22/24 2149  BP: (!) 145/95   Pulse: (!) 107   Resp: (!) 22   Temp: 97.9 F (36.6 C)   TempSrc: Oral   SpO2: 98%   Weight:  (!) 204.1 kg  Height:  6' 2 (1.88 m)   Physical Exam Vitals and nursing note reviewed.  Constitutional:      General: He is not in acute distress.    Comments: Mildly tachypneic but does not appear to be in distress.  Able to carry on a conversation  HENT:     Head: Normocephalic and atraumatic.  Cardiovascular:     Rate and Rhythm: Regular rhythm. Tachycardia present.     Heart sounds: Normal heart sounds.  Pulmonary:     Effort: Tachypnea present.     Breath sounds: Normal breath sounds.  Abdominal:     Palpations: Abdomen is soft.     Tenderness: There is no abdominal tenderness.  Neurological:     Mental Status: Mental status is at baseline.     Labs on Admission: I have personally reviewed following labs and imaging studies  CBC: Recent Labs  Lab 09/22/24 2156  WBC 10.0  HGB 15.3  HCT 45.8  MCV 83.1  PLT 208   Basic Metabolic Panel: Recent Labs  Lab 09/22/24 2156  NA 141  K 4.0  CL 105  CO2 24  GLUCOSE 81  BUN 14  CREATININE 0.86  CALCIUM 9.7   GFR: Estimated Creatinine Clearance: 245.4 mL/min (by C-G formula based on SCr of 0.86  mg/dL). Liver Function Tests: No results for input(s): AST, ALT, ALKPHOS, BILITOT, PROT, ALBUMIN in the last 168 hours. No results for input(s): LIPASE, AMYLASE in the last 168 hours. No results for input(s): AMMONIA in the last 168 hours. Coagulation Profile: Recent Labs  Lab 09/23/24 0145  INR 1.1   Cardiac Enzymes: No results for input(s): CKTOTAL, CKMB, CKMBINDEX, TROPONINI in the last 168 hours. BNP (last 3 results) No results for input(s): PROBNP in the last 8760 hours. HbA1C: No results for input(s): HGBA1C in the last 72 hours. CBG: No results for input(s): GLUCAP in the last 168 hours. Lipid Profile: No results for input(s): CHOL, HDL, LDLCALC, TRIG, CHOLHDL, LDLDIRECT in the last 72 hours. Thyroid Function Tests:  No results for input(s): TSH, T4TOTAL, FREET4, T3FREE, THYROIDAB in the last 72 hours. Anemia Panel: No results for input(s): VITAMINB12, FOLATE, FERRITIN, TIBC, IRON, RETICCTPCT in the last 72 hours. Urine analysis: No results found for: COLORURINE, APPEARANCEUR, LABSPEC, PHURINE, GLUCOSEU, HGBUR, BILIRUBINUR, KETONESUR, PROTEINUR, UROBILINOGEN, NITRITE, LEUKOCYTESUR  Radiological Exams on Admission: CT Angio Chest Pulmonary Embolism (PE) W or WO Contrast Result Date: 09/23/2024 EXAM: CTA of the Chest with contrast for PE 09/23/2024 01:18:38 AM TECHNIQUE: CTA of the chest was performed with and without the administration of 100 mL of iohexol  (OMNIPAQUE ) 350 MG/ML intravenous contrast. Multiplanar reformatted images are provided for review. MIP images are provided for review. Automated exposure control, iterative reconstruction, and/or weight based adjustment of the mA/kV was utilized to reduce the radiation dose to as low as reasonably achievable. COMPARISON: Findings are compared to 03/01/2021. CLINICAL HISTORY: Pulmonary embolism (PE) suspected, high prob. FINDINGS: PULMONARY  ARTERIES: Pulmonary arteries are adequately opacified for evaluation. Subtle pulmonary embolus. Large acute embolic burden. Central pulmonary arteries are enlarged in keeping with changes of pulmonary arterial hypertension. There is, however, no CT evidence of right heart strain. MEDIASTINUM: The heart and pericardium demonstrate no acute abnormality. Cardiac size is within normal limits. No pericardial effusion. The thoracic aorta is unremarkable. LYMPH NODES: No mediastinal, hilar or axillary lymphadenopathy. LUNGS AND PLEURA: Mild bibasilar ground-glass pulmonary infiltrate is present, asymmetrically more severe within the right lower lobe, possible reflecting changes of alveolar pulmonary edema or early exudate in the setting of pulmonary infarct. Peripherally, areas of consolidation are noted within the costophrenic sulcus bilaterally. 4 mm calcified pulmonary nodule within the right middle lobe, image 68/5. No pneumothorax or pleural effusion. No central obstructing lesion. UPPER ABDOMEN: Limited images of the upper abdomen are unremarkable. SOFT TISSUES AND BONES: No acute bone or soft tissue abnormality. The critical findings of this study were discussed directly with Dr. Suzanne by myself at 1:30 am. IMPRESSION: 1. Large acute pulmonary embolic burden without CT evidence of right heart strain; central pulmonary arteries are enlarged, consistent with pulmonary arterial hypertension. 2. Mild bibasilar ground-glass pulmonary infiltrates, right greater than left, possibly reflecting alveolar pulmonary edema or early exudate in the setting of pulmonary infarct, with peripheral consolidations in the costophrenic sulci bilaterally. 3. The critical findings of this study were discussed directly with Dr. Suzanne by myself at 1:30 am. Electronically signed by: Dorethia Molt MD 09/23/2024 01:42 AM EST RP Workstation: [id number]   DG Chest 2 View Result Date: 09/22/2024 CLINICAL DATA:  Chest pain EXAM: CHEST - 2 VIEW  COMPARISON:  Chest x-ray 03/01/2021 FINDINGS: The heart size and mediastinal contours are within normal limits. Both lungs are clear. The visualized skeletal structures are unremarkable. IMPRESSION: No active cardiopulmonary disease. Electronically Signed   By: Greig Pique M.D.   On: 09/22/2024 22:45   Data Reviewed for HPI: Relevant notes from primary care and specialist visits, past discharge summaries as available in EHR, including Care Everywhere. Prior diagnostic testing as pertinent to current admission diagnoses Updated medications and problem lists for reconciliation ED course, including vitals, labs, imaging, treatment and response to treatment Triage notes, nursing and pharmacy notes and ED provider's notes Notable results as noted above in HPI      Assessment and Plan: * Large pulmonary embolism, acute (HCC) History of provoked DVT 02/2021 CT showing large PE without heart strain Heparin  infusion Close hemodynamic monitoring in stepdown Will get lower extremity venous ultrasound Will get hypercoagulable panel  Vascular consult--spoke with Orvin Daring, NP and  will see first thing in the a.m.  Morbid obesity with BMI of 50.0-59.9, adult (HCC) Previously tried Zepbound Working on diet and exercise with his PCP        DVT prophylaxis: On heparin  infusion  Consults: Vascular  Advance Care Planning:   Code Status: Prior   Family Communication: brother at bedside  Disposition Plan: Back to previous home environment  Severity of Illness: The appropriate patient status for this patient is INPATIENT. Inpatient status is judged to be reasonable and necessary in order to provide the required intensity of service to ensure the patient's safety. The patient's presenting symptoms, physical exam findings, and initial radiographic and laboratory data in the context of their chronic comorbidities is felt to place them at high risk for further clinical deterioration.  Furthermore, it is not anticipated that the patient will be medically stable for discharge from the hospital within 2 midnights of admission.   * I certify that at the point of admission it is my clinical judgment that the patient will require inpatient hospital care spanning beyond 2 midnights from the point of admission due to high intensity of service, high risk for further deterioration and high frequency of surveillance required.*  Author: Delayne LULLA Solian, MD 09/23/2024 3:33 AM  For on call review [email or web address].

## 2024-09-23 NOTE — Progress Notes (Signed)
 Patient taken to specials at this time for thrombectomy.

## 2024-09-23 NOTE — ED Notes (Signed)
 CCMD called to initiate cardiac monitoring.

## 2024-09-23 NOTE — Hospital Course (Signed)
 Mr. Dowdy was admitted to the hospital with the working diagnosis of submassive pulmonary embolism.   23 yo male with the past medical history of obesity class 3, and history of right lower extremity deep vein thrombosis post knee surgery in 2022, who presented with chest pain.  He reported acute onset of chest pain, that was associated with dyspnea, fatigue and lightheadedness, worse with exertion. Because severity of symptoms he came to the ED for further evaluation. On his initial physical examination his blood pressure was 145/95, HR 107 RR 22 and 02 saturation 98% Lungs with no wheezing or rhonchi, no rales, heart with S1 and S2 present and tachycardic, abdomen protuberant, but not tender or distended, no lower extremity edema, positive some component of  lymphedema.   Na 141, K 4,0 Cl 105 bicarbonate 24 glucose 81 bun 14 cr 0,86  High sensitive troponin 42 and 28  Wbc 10,0 hgb 15.3 plt 208    Chest radiograph with hypoinflation, positive cardiomegaly, bilateral hilar vascular congestion with no effusions or infiltrates.   EKG 102 bpm, normal axis, normal intervals, qtc 411, sinus rhythm, with SV1, q3T3 pattern, no significant ST segment changes.   CT chest large acute pulmonary embolic burden without evidence of right heart strain, central pulmonary arteries are enlarged.  Mid basilar ground glass opacities, right greater than left.   Lower extremity US , with positive subtotal occlusive venous thrombosis in the left lower extremity extending from the mid femoral vein into the calf veins.  No evidence of deep venous thrombosis on the right lower extremity.   Patient was placed on IV heparin  for anticoagulation.  12/04 thrombolysis bilateral pulmonary arteries with 10 mg of TPA.  Mechanical thrombectomy bilateral lobar pulmonary arteries for removal of pulmonary emboli,  Selective catheter placement right upper lobe pulmonary artery, middle lobe pulmonary artery, and lower lobe pulmonary  artery. Selective catheter placement left upper lobe pulmonary artery and lower lobe pulmonary artery.

## 2024-09-23 NOTE — Assessment & Plan Note (Addendum)
 Submassive pulmonary embolism, possible unprovoked.   Sp TPC, plus thrombectomy to the pulmonary arteries.  Plan to continue anticoagulation with heparin  IV, when stable plan to start patient on rivaroxabam, he likely will need lifelong anticoagulation.   Check echocardiogram.  Continue blood pressure monitoring in stepdown unit.  Add morphine , oxycodone  and acetaminophen  for pain control.  Add pantoprazole during his hospitalization for GI prophylaxis.

## 2024-09-23 NOTE — Progress Notes (Signed)
 PHARMACY - ANTICOAGULATION CONSULT NOTE  Pharmacy Consult for IV Heparin   Indication: pulmonary embolus  Patient Measurements: Height: 6' 2 (188 cm) Weight: (!) 206.2 kg (454 lb 9.4 oz) IBW/kg (Calculated) : 82.2 HEPARIN  DW (KG): 133.8  Labs: Recent Labs    09/22/24 2156 09/23/24 0145 09/23/24 0845 09/23/24 2139  HGB 15.3  --   --   --   HCT 45.8  --   --   --   PLT 208  --   --   --   APTT  --  43*  --   --   LABPROT  --  14.5  --   --   INR  --  1.1  --   --   HEPARINUNFRC  --   --  0.33 0.21*  CREATININE 0.86  --   --   --     Estimated Creatinine Clearance: 246.9 mL/min (by C-G formula based on SCr of 0.86 mg/dL).  Medical History: Past Medical History:  Diagnosis Date   Diabetes mellitus without complication (HCC)    No meds-Last A1C in 2020 was 5.4   Elevated liver enzymes    Family history of adverse reaction to anesthesia    PTS TWIN BROTHER-HARD TIME TO WAKE UP   Hyperlipidemia    Hypertension    off meds x 3-4 years   Morbid obesity (HCC)    Normocytic normochromic anemia    Obesity    Assessment: Pharmacy consulted to dose heparin  in this 23 year old male admitted with PE.  No prior anticoag noted.  Goal of Therapy:  Heparin  level 0.3-0.7 units/ml Monitor platelets by anticoagulation protocol: Yes   Plan:  12/4:  HL @ 2139 = 0.21, SUBtherapeutic - Will order heparin  2000 units IV X 1 bolus and increase drip rate to 2400 units/hr - Recheck HL 6 hrs after rate change  --Daily CBC per protocol  Queenie Aufiero D 09/23/2024,10:00 PM

## 2024-09-23 NOTE — Progress Notes (Signed)
 PHARMACY - ANTICOAGULATION CONSULT NOTE  Pharmacy Consult for  Heparin   Indication: pulmonary embolus  No Known Allergies  Patient Measurements: Height: 6' 2 (188 cm) Weight: (!) 204.1 kg (450 lb) IBW/kg (Calculated) : 82.2 HEPARIN  DW (KG): 133.2  Vital Signs: Temp: 97.9 F (36.6 C) (12/03 2148) Temp Source: Oral (12/03 2148) BP: 145/95 (12/03 2148) Pulse Rate: 107 (12/03 2148)  Labs: Recent Labs    09/22/24 2156  HGB 15.3  HCT 45.8  PLT 208  CREATININE 0.86    Estimated Creatinine Clearance: 245.4 mL/min (by C-G formula based on SCr of 0.86 mg/dL).   Medical History: Past Medical History:  Diagnosis Date   Diabetes mellitus without complication (HCC)    No meds-Last A1C in 2020 was 5.4   Elevated liver enzymes    Family history of adverse reaction to anesthesia    PTS TWIN BROTHER-HARD TIME TO WAKE UP   Hyperlipidemia    Hypertension    off meds x 3-4 years   Morbid obesity (HCC)    Normocytic normochromic anemia    Obesity     Medications:  (Not in a hospital admission)   Assessment: Pharmacy consulted to dose heparin  in this 23 year old male admitted with PE.  No prior anticoag noted. CrCl = 110 ml/min  Goal of Therapy:  Heparin  level 0.3-0.7 units/ml Monitor platelets by anticoagulation protocol: Yes   Plan:  Give 5000 units bolus x 1 Start heparin  infusion at 2150 units/hr Check anti-Xa level in 6 hours and daily while on heparin  Continue to monitor H&H and platelets  Chanice Brenton D 09/23/2024,1:26 AM

## 2024-09-23 NOTE — Progress Notes (Signed)
 PHARMACY - ANTICOAGULATION CONSULT NOTE  Pharmacy Consult for IV Heparin   Indication: pulmonary embolus  Patient Measurements: Height: 6' 2 (188 cm) Weight: (!) 206.2 kg (454 lb 9.4 oz) IBW/kg (Calculated) : 82.2 HEPARIN  DW (KG): 133.8  Labs: Recent Labs    09/22/24 2156 09/23/24 0145 09/23/24 0845  HGB 15.3  --   --   HCT 45.8  --   --   PLT 208  --   --   APTT  --  43*  --   LABPROT  --  14.5  --   INR  --  1.1  --   HEPARINUNFRC  --   --  0.33  CREATININE 0.86  --   --     Estimated Creatinine Clearance: 246.9 mL/min (by C-G formula based on SCr of 0.86 mg/dL).  Medical History: Past Medical History:  Diagnosis Date   Diabetes mellitus without complication (HCC)    No meds-Last A1C in 2020 was 5.4   Elevated liver enzymes    Family history of adverse reaction to anesthesia    PTS TWIN BROTHER-HARD TIME TO WAKE UP   Hyperlipidemia    Hypertension    off meds x 3-4 years   Morbid obesity (HCC)    Normocytic normochromic anemia    Obesity    Assessment: Pharmacy consulted to dose heparin  in this 23 year old male admitted with PE.  No prior anticoag noted.  Goal of Therapy:  Heparin  level 0.3-0.7 units/ml Monitor platelets by anticoagulation protocol: Yes   Plan:  --Heparin  level is therapeutic x 1. Continue heparin  infusion at 2150 units/hr --Re-check heparin  level in 6 hours --Daily CBC per protocol  Marolyn KATHEE Mare 09/23/2024,9:32 AM

## 2024-09-23 NOTE — ED Notes (Signed)
 Pharmacy called due to IV infiltration

## 2024-09-24 ENCOUNTER — Telehealth (HOSPITAL_COMMUNITY): Payer: Self-pay

## 2024-09-24 ENCOUNTER — Inpatient Hospital Stay: Admit: 2024-09-24 | Discharge: 2024-09-24 | Disposition: A | Attending: Internal Medicine

## 2024-09-24 ENCOUNTER — Encounter (HOSPITAL_COMMUNITY): Payer: Self-pay

## 2024-09-24 ENCOUNTER — Other Ambulatory Visit (HOSPITAL_COMMUNITY): Payer: Self-pay

## 2024-09-24 DIAGNOSIS — I2699 Other pulmonary embolism without acute cor pulmonale: Secondary | ICD-10-CM | POA: Diagnosis not present

## 2024-09-24 DIAGNOSIS — I82412 Acute embolism and thrombosis of left femoral vein: Secondary | ICD-10-CM

## 2024-09-24 LAB — CBC
HCT: 39.4 % (ref 39.0–52.0)
Hemoglobin: 13.2 g/dL (ref 13.0–17.0)
MCH: 28 pg (ref 26.0–34.0)
MCHC: 33.5 g/dL (ref 30.0–36.0)
MCV: 83.5 fL (ref 80.0–100.0)
Platelets: 171 K/uL (ref 150–400)
RBC: 4.72 MIL/uL (ref 4.22–5.81)
RDW: 13 % (ref 11.5–15.5)
WBC: 8.2 K/uL (ref 4.0–10.5)
nRBC: 0 % (ref 0.0–0.2)

## 2024-09-24 LAB — BASIC METABOLIC PANEL WITH GFR
Anion gap: 12 (ref 5–15)
BUN: 13 mg/dL (ref 6–20)
CO2: 23 mmol/L (ref 22–32)
Calcium: 8.9 mg/dL (ref 8.9–10.3)
Chloride: 102 mmol/L (ref 98–111)
Creatinine, Ser: 0.77 mg/dL (ref 0.61–1.24)
GFR, Estimated: >60 mL/min (ref >60)
Glucose, Bld: 87 mg/dL (ref 70–99)
Potassium: 4.2 mmol/L (ref 3.5–5.1)
Sodium: 137 mmol/L (ref 135–145)

## 2024-09-24 LAB — BETA-2-GLYCOPROTEIN I ABS, IGG/M/A
Beta-2 Glyco I IgG: <9 GPI IgG units (ref 0–20)
Beta-2-Glycoprotein I IgA: <9 GPI IgA units (ref 0–25)
Beta-2-Glycoprotein I IgM: <9 GPI IgM units (ref 0–32)

## 2024-09-24 LAB — PROTEIN C, TOTAL: Protein C, Total: 100 % (ref 60–150)

## 2024-09-24 LAB — ECHOCARDIOGRAM COMPLETE
Height: 74 in
S' Lateral: 2.9 cm
Weight: 7273.42 [oz_av]

## 2024-09-24 LAB — HEPARIN LEVEL (UNFRACTIONATED)
Heparin Unfractionated: 0.59 [IU]/mL (ref 0.30–0.70)
Heparin Unfractionated: 0.45 [IU]/mL (ref 0.30–0.70)

## 2024-09-24 LAB — HOMOCYSTEINE: Homocysteine: 10.5 umol/L (ref 0.0–14.5)

## 2024-09-24 LAB — MAGNESIUM: Magnesium: 1.9 mg/dL (ref 1.7–2.4)

## 2024-09-24 MED ORDER — APIXABAN 5 MG PO TABS
10.0000 mg | ORAL_TABLET | Freq: Two times a day (BID) | ORAL | Status: DC
  Administered 2024-09-24 – 2024-09-26 (×5): 10 mg via ORAL
  Filled 2024-09-24 (×5): qty 2

## 2024-09-24 MED ORDER — APIXABAN 5 MG PO TABS: 5.0000 mg | ORAL_TABLET | Freq: Two times a day (BID) | ORAL | Status: DC

## 2024-09-24 NOTE — Telephone Encounter (Signed)
 Pharmacy Patient Advocate Encounter  Insurance verification completed.    The patient is insured through [employer] [facility]. Patient has [employer], may use a copay card, and/or apply for patient assistance if available.    Ran test claim for Eliquis  5mg  tablet and the current 30 day co-pay is $60.   This test claim was processed through [employer]- copay amounts may vary at other pharmacies due to [employer], or as the patient moves through the different stages of their insurance plan.

## 2024-09-24 NOTE — Progress Notes (Signed)
 PHARMACY - ANTICOAGULATION CONSULT NOTE  Pharmacy Consult for IV Heparin   Indication: pulmonary embolus  Patient Measurements: Height: 6' 2 (188 cm) Weight: (!) 206.2 kg (454 lb 9.4 oz) IBW/kg (Calculated) : 82.2 HEPARIN  DW (KG): 133.8  Labs: Recent Labs    09/22/24 2156 09/23/24 0145 09/23/24 0845 09/23/24 2139 09/24/24 0342  HGB 15.3  --   --   --  13.2  HCT 45.8  --   --   --  39.4  PLT 208  --   --   --  171  APTT  --  43*  --   --   --   LABPROT  --  14.5  --   --   --   INR  --  1.1  --   --   --   HEPARINUNFRC  --   --  0.33 0.21* 0.59  CREATININE 0.86  --   --   --  0.77    Estimated Creatinine Clearance: 265.4 mL/min (by C-G formula based on SCr of 0.77 mg/dL).  Medical History: Past Medical History:  Diagnosis Date   Diabetes mellitus without complication (HCC)    No meds-Last A1C in 2020 was 5.4   Elevated liver enzymes    Family history of adverse reaction to anesthesia    PTS TWIN BROTHER-HARD TIME TO WAKE UP   Hyperlipidemia    Hypertension    off meds x 3-4 years   Morbid obesity (HCC)    Normocytic normochromic anemia    Obesity    Assessment: Pharmacy consulted to dose heparin  in this 23 year old male admitted with PE.  No prior anticoag noted.  Goal of Therapy:  Heparin  level 0.3-0.7 units/ml Monitor platelets by anticoagulation protocol: Yes   Plan:  12/5:  HL @ 0342 = 0.59, therapeutic X 1 - Will continue pt on current rate and recheck HL in 6 hrs.  - Daily CBC per protocol  Clarabell Matsuoka D 09/24/2024,4:42 AM

## 2024-09-24 NOTE — Progress Notes (Signed)
*  PRELIMINARY RESULTS* Echocardiogram 2D Echocardiogram has been performed.  Todd Ward 09/24/2024, 11:06 AM

## 2024-09-24 NOTE — Progress Notes (Signed)
  PROGRESS NOTE    CORNELIS KLUVER  FMW:[id number] DOB: 2001-03-16 DOA: 09/22/2024 PCP: Alla Amis, MD  127A/127A-AA  LOS: 1 day   Brief hospital course:   Assessment & Plan: Mr. Calvin was admitted to the hospital with the working diagnosis of submassive pulmonary embolism.    23 yo male with the past medical history of obesity class 3, and history of right lower extremity deep vein thrombosis post knee surgery in 2022, who presented with chest pain.  He reported acute onset of chest pain, that was associated with dyspnea, fatigue and lightheadedness, worse with exertion. Because severity of symptoms he came to the ED for further evaluation.   Chest pain 2/2 * Submassive PE S/p thrombectomy --transition from heparin  gtt to Eliquis  today --Echo unremarkable --outpatient hematology referral   DVT, lower extremity, distal, acute, left (HCC) --transition from heparin  gtt to Eliquis  today   Obesity, class 3 (HCC) Calculated BMI is 58.3    DVT prophylaxis: On:Eliquis  Code Status: Full code  Family Communication: parents updated at bedside today Level of care: Med-Surg Dispo:   The patient is from: home Anticipated d/c is to: home Anticipated d/c date is: tomorrow   Subjective and Interval History:  Chest pain improved later today.   Objective: Vitals:   09/24/24 0915 09/24/24 1141 09/24/24 1712 09/24/24 2008  BP: 129/75 136/88 118/74 (!) 141/71  Pulse: 61 82 81 81  Resp: 19 18 18 17   Temp: 98.1 F (36.7 C) 98.2 F (36.8 C) 98.2 F (36.8 C) 97.9 F (36.6 C)  TempSrc:   Oral   SpO2: 95% 96% 98% 99%  Weight:      Height:        Intake/Output Summary (Last 24 hours) at 09/24/2024 2009 Last data filed at 09/24/2024 1900 Gross per 24 hour  Intake 807.7 ml  Output 700 ml  Net 107.7 ml   Filed Weights   09/22/24 2149 09/23/24 0628  Weight: (!) 204.1 kg (!) 206.2 kg    Examination:   Constitutional: NAD, AAOx3 HEENT: conjunctivae and lids normal,  EOMI CV: No cyanosis.   RESP: normal respiratory effort, on RA Neuro: II - XII grossly intact.   Psych: Normal mood and affect.  Appropriate judgement and reason   Data Reviewed: I have personally reviewed labs and imaging studies  Time spent: 50 minutes  Ellouise Haber, MD [facility] If 7PM-7AM, please contact night-coverage 09/24/2024, 8:09 PM

## 2024-09-24 NOTE — Progress Notes (Signed)
 PHARMACY - ANTICOAGULATION CONSULT NOTE  Pharmacy Consult for IV Heparin  to Apixaban  Indication: pulmonary embolus  Patient Measurements: Height: 6' 2 (188 cm) Weight: (!) 206.2 kg (454 lb 9.4 oz) IBW/kg (Calculated) : 82.2 HEPARIN  DW (KG): 133.8  Labs: Recent Labs    09/22/24 2156 09/23/24 0145 09/23/24 0845 09/23/24 2139 09/24/24 0342 09/24/24 0955  HGB 15.3  --   --   --  13.2  --   HCT 45.8  --   --   --  39.4  --   PLT 208  --   --   --  171  --   APTT  --  43*  --   --   --   --   LABPROT  --  14.5  --   --   --   --   INR  --  1.1  --   --   --   --   HEPARINUNFRC  --   --    < > 0.21* 0.59 0.45  CREATININE 0.86  --   --   --  0.77  --    < > = values in this interval not displayed.    Estimated Creatinine Clearance: 265.4 mL/min (by C-G formula based on SCr of 0.77 mg/dL).  Medical History: Past Medical History:  Diagnosis Date   Diabetes mellitus without complication (HCC)    No meds-Last A1C in 2020 was 5.4   Elevated liver enzymes    Family history of adverse reaction to anesthesia    PTS TWIN BROTHER-HARD TIME TO WAKE UP   Hyperlipidemia    Hypertension    off meds x 3-4 years   Morbid obesity (HCC)    Normocytic normochromic anemia    Obesity    Assessment: Pharmacy consulted to dose apixaban  in this 23 year old male admitted with PE.  Patient was previously on IV heparin .   Goal of Therapy:  Monitor platelets by anticoagulation protocol: Yes   Plan:  - Start apixaban  10 mg PO twice daily x 7 days followed by apixaban  5 mg PO twice daily - Monitor Scr and CBC weekly unless more frequent monitoring is needed  Lum VEAR Mania, PharmD, BCPS 09/24/2024,11:07 AM

## 2024-09-24 NOTE — TOC CM/SW Note (Signed)
 Transition of Care [facility]) - Inpatient Brief Assessment   Patient Details  Name: Todd Ward MRN: [id number] Date of Birth: 05/30/2001  Transition of [facility]) CM/SW Contact:    Daved JONETTA Hamilton, RN Phone Number: 09/24/2024, 12:22 PM   Clinical Narrative:   Transition of Care (TOC) Screening Note   Patient Details  Name: Todd Ward Date of Birth: 05-25-2001   Transition of Care [facility]) CM/SW Contact:    Daved JONETTA Hamilton, RN Phone Number: 09/24/2024, 12:23 PM    Transition of Care Department [facility]) has reviewed patient and no TOC needs have been identified at this time. If new patient transition needs arise, please place a TOC consult.    Transition of Care Asessment: Insurance and Status: Insurance coverage has been reviewed Patient has primary care physician: Yes   Prior level of function:: Independent Prior/Current Home Services: No current home services Social Drivers of Health Review: SDOH reviewed no interventions necessary Readmission risk has been reviewed: Yes Transition of care needs: no transition of care needs at this time

## 2024-09-24 NOTE — Telephone Encounter (Signed)
 ERROR

## 2024-09-24 NOTE — Progress Notes (Signed)
 Progress Note    09/24/2024 8:37 AM 1 Day Post-Op  Subjective:  Todd Ward is a 23 yo male now POD #1 from:  Date of Surgery: 09/23/2024,1:35 PM   Surgeon:Schnier, Todd MATSU    Pre-operative Diagnosis: Symptomatic pulmonary emboli with right heart strain and hypoxia   Post-operative diagnosis:  Same   Procedure(s) Performed:             1.  Contrast injection right heart and bilateral pulmonary arteries             2.  Thrombolysis bilateral pulmonary arteries with 10 mg of TPA             3.  Mechanical thrombectomy bilateral lobar pulmonary arteries for removal of pulmonary emboli using the Penumbra CAT 8 thrombectomy catheter.             4.  Selective catheter placement right upper lobe pulmonary artery, middle lobe pulmonary artery and lower lobe pulmonary artery             5.  Selective catheter placement left upper lobe pulmonary artery and lower lobe pulmonary artery                Anesthesia: Conscious sedation was administered under my direct supervision by the interventional radiology RN. IV Versed  plus fentanyl  were utilized. Continuous ECG, pulse oximetry and blood pressure was monitored throughout the entire procedure.  Versed  and fentanyl  were administered intravenously.  Conscious sedation was administered for a total of 52 minutes and 47 seconds.   Sheath: 9 French Pinnacle sheath right common femoral vein antegrade   Contrast: 60 cc    Vitals:   09/24/24 0700 09/24/24 0800  BP: 125/71 (!) 153/95  Pulse: 68 62  Resp: (!) 26 12  Temp:    SpO2: 94% 99%   Physical Exam: Cardiac:  RRR, normal S1 and S2.  No murmurs appreciated. Lungs: All lung fields clear on auscultation.  Normal labored breathing.  No supplemental oxygen required.  Oxygen saturation this morning is 95 to 96% on room air. Incisions: Right groin puncture site with dressing clean dry and intact.  No hematoma seroma. Extremities: All extremities warm to touch with palpable pulses.  Left  lower extremity with DVT is nonpainful on palpation and is not swollen. Abdomen: Positive bowel sounds throughout, morbidly obese, soft, nontender Neurologic: Alert and oriented x 3, answers questions and follows commands.  CBC    Component Value Date/Time   WBC 8.2 09/24/2024 0342   RBC 4.72 09/24/2024 0342   HGB 13.2 09/24/2024 0342   HCT 39.4 09/24/2024 0342   PLT 171 09/24/2024 0342   MCV 83.5 09/24/2024 0342   MCH 28.0 09/24/2024 0342   MCHC 33.5 09/24/2024 0342   RDW 13.0 09/24/2024 0342    BMET    Component Value Date/Time   NA 137 09/24/2024 0342   K 4.2 09/24/2024 0342   CL 102 09/24/2024 0342   CO2 23 09/24/2024 0342   GLUCOSE 87 09/24/2024 0342   BUN 13 09/24/2024 0342   CREATININE 0.77 09/24/2024 0342   CALCIUM 8.9 09/24/2024 0342   GFRNONAA >60 09/24/2024 0342    INR    Component Value Date/Time   INR 1.1 09/23/2024 0145     Intake/Output Summary (Last 24 hours) at 09/24/2024 0837 Last data filed at 09/24/2024 0452 Gross per 24 hour  Intake 1260.42 ml  Output 700 ml  Net 560.42 ml     Assessment/Plan:  23 y.o. male  is s/p SEE ABOVE 1 Day Post-Op   PLAN Patient does not need any further intervention.  There was a discussion during the consult as he may need a left lower extremity thrombectomy.  After reviewing the imaging with Dr. Cordella Shawl MD it was decided he does not need another intervention.  Therefore he can eat breakfast this morning. Discontinue heparin  infusion this morning and convert to oral anticoagulation.  Patient was on Eliquis  prior back in 2022 post knee surgery and this worked well.  Therefore I agree with putting him back on Eliquis  10 mg twice daily for 7 days then decrease dose to 5 mg twice daily indefinitely.  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 Okay for patient to transfer to the floor. Echocardiogram scheduled for today. Consult placed to  pulmonology Dr. Parris MD to follow-up for chest discomfort due to pulmonary hypertension.  DVT prophylaxis: Heparin  infusion.   [name] 09/24/2024 8:37 AM

## 2024-09-24 NOTE — Progress Notes (Signed)
 PHARMACY - ANTICOAGULATION CONSULT NOTE  Pharmacy Consult for IV Heparin   Indication: pulmonary embolus  Patient Measurements: Height: 6' 2 (188 cm) Weight: (!) 206.2 kg (454 lb 9.4 oz) IBW/kg (Calculated) : 82.2 HEPARIN  DW (KG): 133.8  Labs: Recent Labs    09/22/24 2156 09/23/24 0145 09/23/24 0845 09/23/24 2139 09/24/24 0342 09/24/24 0955  HGB 15.3  --   --   --  13.2  --   HCT 45.8  --   --   --  39.4  --   PLT 208  --   --   --  171  --   APTT  --  43*  --   --   --   --   LABPROT  --  14.5  --   --   --   --   INR  --  1.1  --   --   --   --   HEPARINUNFRC  --   --    < > 0.21* 0.59 0.45  CREATININE 0.86  --   --   --  0.77  --    < > = values in this interval not displayed.    Estimated Creatinine Clearance: 265.4 mL/min (by C-G formula based on SCr of 0.77 mg/dL).  Medical History: Past Medical History:  Diagnosis Date   Diabetes mellitus without complication (HCC)    No meds-Last A1C in 2020 was 5.4   Elevated liver enzymes    Family history of adverse reaction to anesthesia    PTS TWIN BROTHER-HARD TIME TO WAKE UP   Hyperlipidemia    Hypertension    off meds x 3-4 years   Morbid obesity (HCC)    Normocytic normochromic anemia    Obesity    Assessment: Pharmacy consulted to dose heparin  in this 24 year old male admitted with PE.  No prior anticoag noted.  12/5:  HL @ 0342 = 0.59, therapeutic X 1 12/5:  HL @ 0955 = 0.45, therapeutic X 2  Goal of Therapy:  Heparin  level 0.3-0.7 units/ml Monitor platelets by anticoagulation protocol: Yes   Plan:  - Heparin  level therapeutic x 2 - Will continue pt on current rate  - Can transition to daily monitoring of HL  - Daily CBC per protocol - Follow-up transition to oral anticoagulation   Lum VEAR Mania, PharmD, BCPS 09/24/2024,10:40 AM

## 2024-09-25 ENCOUNTER — Inpatient Hospital Stay

## 2024-09-25 DIAGNOSIS — I2692 Saddle embolus of pulmonary artery without acute cor pulmonale: Secondary | ICD-10-CM | POA: Diagnosis not present

## 2024-09-25 LAB — CARDIOLIPIN ANTIBODIES, IGG, IGM, IGA
Anticardiolipin IgA: <9 U/mL (ref 0–11)
Anticardiolipin IgG: <9 GPL U/mL (ref 0–14)
Anticardiolipin IgM: <9 [MPL'U]/mL (ref 0–12)

## 2024-09-25 LAB — CBC
HCT: 43.3 % (ref 39.0–52.0)
Hemoglobin: 14.2 g/dL (ref 13.0–17.0)
MCH: 27 pg (ref 26.0–34.0)
MCHC: 32.8 g/dL (ref 30.0–36.0)
MCV: 82.5 fL (ref 80.0–100.0)
Platelets: 162 K/uL (ref 150–400)
RBC: 5.25 MIL/uL (ref 4.22–5.81)
RDW: 12.6 % (ref 11.5–15.5)
WBC: 9.9 K/uL (ref 4.0–10.5)
nRBC: 0 % (ref 0.0–0.2)

## 2024-09-25 LAB — TROPONIN T, HIGH SENSITIVITY: Troponin T High Sensitivity: <15 ng/L (ref 0–19)

## 2024-09-25 MED ORDER — OXYCODONE HCL 5 MG PO TABS
5.0000 mg | ORAL_TABLET | ORAL | Status: DC | PRN
  Administered 2024-09-25: 10 mg via ORAL
  Filled 2024-09-25: qty 2

## 2024-09-25 MED ORDER — ALPRAZOLAM 0.5 MG PO TABS
0.5000 mg | ORAL_TABLET | Freq: Three times a day (TID) | ORAL | Status: DC | PRN
  Administered 2024-09-25 (×2): 0.5 mg via ORAL
  Filled 2024-09-25 (×2): qty 1

## 2024-09-25 MED ORDER — IOHEXOL 350 MG/ML SOLN
75.0000 mL | Freq: Once | INTRAVENOUS | Status: AC | PRN
  Administered 2024-09-25: 75 mL via INTRAVENOUS

## 2024-09-25 MED ORDER — MORPHINE SULFATE (PF) 4 MG/ML IV SOLN
4.0000 mg | Freq: Once | INTRAVENOUS | Status: AC
  Administered 2024-09-25: 4 mg via INTRAVENOUS
  Filled 2024-09-25: qty 1

## 2024-09-25 MED ORDER — OXYCODONE-ACETAMINOPHEN 7.5-325 MG PO TABS
1.0000 | ORAL_TABLET | ORAL | Status: DC | PRN
  Administered 2024-09-26 (×2): 2 via ORAL
  Filled 2024-09-25 (×2): qty 2

## 2024-09-25 NOTE — Progress Notes (Signed)
  PROGRESS NOTE    JB DULWORTH  FMW:[id number] DOB: 03/21/2001 DOA: 09/22/2024 PCP: Alla Amis, MD  127A/127A-AA  LOS: 2 days   Brief hospital course:   Assessment & Plan: Mr. Silliman was admitted to the hospital with the working diagnosis of submassive pulmonary embolism.    23 yo male with the past medical history of obesity class 3, and history of right lower extremity deep vein thrombosis post knee surgery in 2022, who presented with chest pain.  He reported acute onset of chest pain, that was associated with dyspnea, fatigue and lightheadedness, worse with exertion. Because severity of symptoms he came to the ED for further evaluation.   Chest pain 2/2 * Submassive PE S/p thrombectomy on 09/23/24 --having recurrent intermittent severe chest pain, no clear etiology. --Echo unremarkable.  Repeat CT chest PE today showed improvement and no pulm infarct. --cont Eliquis  --Percocet  --avoid IV pain med to prepare for discharge tomorrow --outpatient hematology referral   DVT, lower extremity, distal, acute, left (HCC) --cont Eliquis    Obesity, class 3 (HCC) Calculated BMI is 58.3    DVT prophylaxis: On:Eliquis  Code Status: Full code  Family Communication: parents updated at bedside today Level of care: Med-Surg Dispo:   The patient is from: home Anticipated d/c is to: home Anticipated d/c date is: tomorrow   Subjective and Interval History:  Overnight, severe chest pain returned, under left lower rib cage/flank area.  Repeat CTA chest showed improvement.   Objective: Vitals:   09/25/24 0448 09/25/24 0730 09/25/24 1201 09/25/24 1554  BP: 139/75 (!) 152/102 (!) 137/95 123/86  Pulse: 75 94 99 90  Resp: 20 20 18 20   Temp: 98.5 F (36.9 C) 99.1 F (37.3 C) 98.8 F (37.1 C) 98.1 F (36.7 C)  TempSrc: Oral Oral Oral Oral  SpO2: 95% 96% 95% 96%  Weight:      Height:       No intake or output data in the 24 hours ending 09/25/24 2003  Filed Weights    09/22/24 2149 09/23/24 0628  Weight: (!) 204.1 kg (!) 206.2 kg    Examination:   Constitutional: NAD, AAOx3 HEENT: conjunctivae and lids normal, EOMI CV: No cyanosis.   RESP: normal respiratory effort, on RA Neuro: II - XII grossly intact.   Psych: Normal mood and affect.  Appropriate judgement and reason   Data Reviewed: I have personally reviewed labs and imaging studies  Time spent: 35 minutes  Ellouise Haber, MD [facility] If 7PM-7AM, please contact night-coverage 09/25/2024, 8:03 PM

## 2024-09-25 NOTE — Progress Notes (Addendum)
      Daily Progress Note   Assessment/Planning:   POD #2 s/p PE thrombectomy  Unclear etiology of pt's sx Stat CTA chest ordered   Subjective  - 2 Days Post-Op   C/o 10/10 sharp chest pain   Objective   Vitals:   09/25/24 0017 09/25/24 0429 09/25/24 0448 09/25/24 0730  BP: 131/73 134/85 139/75 (!) 152/102  Pulse: 81 73 75 94  Resp: 20  20 20   Temp: 98 F (36.7 C) 98.6 F (37 C) 98.5 F (36.9 C) 99.1 F (37.3 C)  TempSrc:   Oral Oral  SpO2: 98% 94% 95% 96%  Weight:      Height:         Intake/Output Summary (Last 24 hours) at 09/25/2024 0824 Last data filed at 09/24/2024 1900 Gross per 24 hour  Intake 600 ml  Output --  Net 600 ml    GEN Sleeping upon entry to room  PULM CTAB, sym exp, good air movt  CV RRR, nl s1,s2, no M/G    Laboratory   CBC    Latest Ref Rng & Units 09/24/2024    3:42 AM 09/22/2024    9:56 PM 03/05/2021    5:42 AM  CBC  WBC 4.0 - 10.5 K/uL 8.2  10.0  10.4   Hemoglobin 13.0 - 17.0 g/dL 86.7  84.6  9.3   Hematocrit 39.0 - 52.0 % 39.4  45.8  29.1   Platelets 150 - 400 K/uL 171  208  331     BMET    Component Value Date/Time   NA 137 09/24/2024 0342   K 4.2 09/24/2024 0342   CL 102 09/24/2024 0342   CO2 23 09/24/2024 0342   GLUCOSE 87 09/24/2024 0342   BUN 13 09/24/2024 0342   CREATININE 0.77 09/24/2024 0342   CALCIUM 8.9 09/24/2024 0342   GFRNONAA >60 09/24/2024 0342     Redell Door, MD, FACS, FSVS Covering for [facility]: [phone]  09/25/2024, 8:24 AM   Addendum  CTA Chest (09/22/24) 1. Mild interval improvement of extensive bilateral thromboembolic disease, with partial dissipation of thromboembolus on the right and partial recanalization of the lobar pulmonary arteries. 2. Main pulmonary artery is less distended, measuring approximately 2.6 cm currently versus 3.6 cm previously. 3. Streaky peribronchovascular opacities within the lower lobe bases bilaterally and the lingula,  likely representing atelectasis.  I reviewed the CTA: - no evidence of aortic dissection - nothing of CTA to account for pt's sx  CTA does not identified an etiology for his sx Further work-up primary team.

## 2024-09-26 ENCOUNTER — Other Ambulatory Visit: Payer: Self-pay

## 2024-09-26 MED ORDER — APIXABAN 5 MG PO TABS
5.0000 mg | ORAL_TABLET | Freq: Two times a day (BID) | ORAL | 4 refills | Status: AC
  Filled 2024-09-26: qty 60, 30d supply, fill #0

## 2024-09-26 MED ORDER — OXYCODONE-ACETAMINOPHEN 5-325 MG PO TABS
1.0000 | ORAL_TABLET | Freq: Four times a day (QID) | ORAL | 0 refills | Status: AC | PRN
  Filled 2024-09-26: qty 30, 4d supply, fill #0

## 2024-09-26 MED ORDER — APIXABAN (ELIQUIS) VTE STARTER PACK (10MG AND 5MG)
ORAL_TABLET | ORAL | 0 refills | Status: AC
  Filled 2024-09-26: qty 74, 30d supply, fill #0

## 2024-09-26 MED ORDER — ACETAMINOPHEN 325 MG PO TABS: 650.0000 mg | ORAL_TABLET | Freq: Four times a day (QID) | ORAL | Status: AC | PRN

## 2024-09-26 NOTE — Plan of Care (Signed)

## 2024-09-26 NOTE — Discharge Summary (Signed)
 Physician Discharge Summary   Todd Ward  male DOB: 2001/07/04  FMW:[id number]  PCP: Alla Amis, MD  Admit date: 09/22/2024 Discharge date: ***  Admitted From: home Disposition:  home CODE STATUS: Full code  Discharge Instructions     Ambulatory referral to Hematology / Oncology   Complete by: As directed    Discharge instructions   Complete by: As directed    Per Dr. Genevia, There really aren't any restrictions. The more/longer he is up on his feet sitting or standing the more his leg with the DVT will swell and be painful. So he needs to take it easy and break up his activities. Lots of  elevation. Use a recliner. No full contact activities because of the anticoagulation.  Please finish the Eliquis  starter pack, then go to 5 mg twice daily.  You are referred to see a hematologist. [facility] Course:  For full details, please see H&P, progress notes, consult notes and ancillary notes.  Briefly,  ***  Unless noted above, medications under STOP list are ones pt was not taking PTA.  Discharge Diagnoses:  Principal Problem:   Large pulmonary embolism, acute (HCC) Active Problems:   DVT, lower extremity, distal, acute, left (HCC)   Obesity, class 3 (HCC)   DVT of deep femoral vein, left (HCC)   30 Day Unplanned Readmission Risk Score    Flowsheet Row ED to Hosp-Admission (Current) from 09/22/2024 in [facility] 1C MEDICAL TELEMETRY  30 Day Unplanned Readmission Risk Score (%) 8.14 Filed at 09/26/2024 0801    This score is the patient's risk of an unplanned readmission within 30 days of being discharged (0 -100%). The score is based on dignosis, age, lab data, medications, orders, and past utilization.   Low:  0-14.9   Medium: 15-21.9   High: 22-29.9   Extreme: 30 and above         Discharge Instructions:  Allergies as of 09/26/2024   No Known Allergies      Medication List     STOP taking these medications     albuterol  108 (90 Base) MCG/ACT inhaler Commonly known as: VENTOLIN  HFA   amoxicillin 500 MG tablet Commonly known as: AMOXIL   Melatonin 2.5 MG Chew   methocarbamol  500 MG tablet Commonly known as: ROBAXIN    multivitamin with minerals tablet   ondansetron  4 MG tablet Commonly known as: ZOFRAN    oxyCODONE  5 MG immediate release tablet Commonly known as: Oxy IR/ROXICODONE    polyethylene glycol 17 g packet Commonly known as: MIRALAX  / GLYCOLAX    traMADol  50 MG tablet Commonly known as: ULTRAM        TAKE these medications    acetaminophen  325 MG tablet Commonly known as: TYLENOL  Take 2 tablets (650 mg total) by mouth every 6 (six) hours as needed for mild pain (pain score 1-3) or moderate pain (pain score 4-6).   Apixaban  Starter Pack (10mg  and 5mg ) Commonly known as: ELIQUIS  STARTER PACK Take as directed on package: start with two-5mg  tablets twice daily for 7 days. On day 8, switch to one-5mg  tablet twice daily.   apixaban  5 MG Tabs tablet Commonly known as: ELIQUIS  Take 1 tablet (5 mg total) by mouth 2 (two) times daily. Start taking on: October 26, 2024   menthol 3 MG lozenge Commonly known as: CEPACOL Take 1 lozenge by mouth as needed for sore throat.   oxyCODONE -acetaminophen  5-325 MG tablet Commonly known as: Percocet Take 1-2 tablets by  mouth every 6 (six) hours as needed for severe pain (pain score 7-10).         Follow-up Information     Alla Amis, MD Follow up.   Specialty: Family Medicine Why: hospital follow up Contact information: [address] [facility] [place] [address] [phone]         Schnier, Cordella MATSU, MD Follow up.   Specialties: Vascular Surgery, Cardiology, Radiology, Vascular Surgery Contact information: [address] [place] [address] [phone]                 No Known Allergies   The results of significant diagnostics from this hospitalization  (including imaging, microbiology, ancillary and laboratory) are listed below for reference.   Consultations:   Procedures/Studies: CT Angio Chest Pulmonary Embolism (PE) W or WO Contrast Result Date: 09/25/2024 EXAM: CTA of the Chest with contrast for PE 09/25/2024 08:52:11 AM TECHNIQUE: CTA of the chest was performed after the administration of 75 mL of iohexol  (OMNIPAQUE ) 350 MG/ML injection. Multiplanar reformatted images are provided for review. MIP images are provided for review. Automated exposure control, iterative reconstruction, and/or weight based adjustment of the mA/kV was utilized to reduce the radiation dose to as low as reasonably achievable. COMPARISON: CT of the chest dated 09/23/2024. CLINICAL HISTORY: Pulmonary embolism (PE) suspected, high prob. FINDINGS: PULMONARY ARTERIES: Pulmonary arteries are adequately opacified for evaluation. There has been mild interval improvement of extensive bilateral thromboembolic disease. The subtle thromboembolus evident on the previous study has partially dissipated on the right and the lobar pulmonary arteries partially recanalated. The main pulmonary artery is less distended, measuring approximately 2.6 cm in diameter on the current exam versus 3.6 cm previously. MEDIASTINUM: The heart and pericardium demonstrate no acute abnormality. There is no acute abnormality of the thoracic aorta. LYMPH NODES: No mediastinal, hilar or axillary lymphadenopathy. LUNGS AND PLEURA: There are streaky peribronchovascular opacities present within the bases of the lower lobes bilaterally and the lingula, likely representing atelectasis. No pleural effusion or pneumothorax. UPPER ABDOMEN: Limited images of the upper abdomen are unremarkable. SOFT TISSUES AND BONES: No acute bone or soft tissue abnormality. IMPRESSION: 1. Mild interval improvement of extensive bilateral thromboembolic disease, with partial dissipation of thromboembolus on the right and partial recanalization  of the lobar pulmonary arteries. 2. Main pulmonary artery is less distended, measuring approximately 2.6 cm currently versus 3.6 cm previously. 3. Streaky peribronchovascular opacities within the lower lobe bases bilaterally and the lingula, likely representing atelectasis. Electronically signed by: Evalene Coho MD 09/25/2024 09:20 AM EST RP Workstation: [id number]   ECHOCARDIOGRAM COMPLETE Result Date: 09/24/2024    ECHOCARDIOGRAM REPORT   Patient Name:   Todd Ward Date of Exam: 09/24/2024 Medical Rec #:  [id number]      Height:       74.0 in Accession #:    [id number]     Weight:       454.6 lb Date of Birth:  04-09-2001      BSA:          3.081 m Patient Age:    23 years       BP:           153/95 mmHg Patient Gender: M              HR:           62 bpm. Exam Location:  [facility] Procedure: 2D Echo, Cardiac Doppler and Color Doppler (Both Spectral and Color  Flow Doppler were utilized during procedure). Indications:     Dyspnea R06.00  History:         Patient has no prior history of Echocardiogram examinations.                  Risk Factors:Diabetes, Hypertension and Dyslipidemia.  Sonographer:     Christopher Furnace Referring Phys:  [id number] ELIDIA SIEVING ARRIEN Diagnosing Phys: Cara JONETTA Lovelace MD  Sonographer Comments: Technically challenging study due to limited acoustic windows, no apical window and no subcostal window. IMPRESSIONS  1. Left ventricular ejection fraction, by estimation, is 65 to 70%. The left ventricle has normal function. The left ventricle has no regional wall motion abnormalities. There is mild asymmetric left ventricular hypertrophy. Left ventricular diastolic function could not be evaluated.  2. Right ventricular systolic function is normal. The right ventricular size is normal.  3. The mitral valve is normal in structure. Trivial mitral valve regurgitation.  4. The aortic valve is grossly normal. Aortic valve regurgitation is not visualized. FINDINGS  Left Ventricle: Left  ventricular ejection fraction, by estimation, is 65 to 70%. The left ventricle has normal function. The left ventricle has no regional wall motion abnormalities. Strain was performed and the global longitudinal strain is indeterminate. The left ventricular internal cavity size was normal in size. There is mild asymmetric left ventricular hypertrophy. Left ventricular diastolic function could not be evaluated. Right Ventricle: The right ventricular size is normal. No increase in right ventricular wall thickness. Right ventricular systolic function is normal. Left Atrium: Left atrial size was normal in size. Right Atrium: Right atrial size was normal in size. Pericardium: There is no evidence of pericardial effusion. Mitral Valve: The mitral valve is normal in structure. Trivial mitral valve regurgitation. Tricuspid Valve: The tricuspid valve is normal in structure. Tricuspid valve regurgitation is trivial. Aortic Valve: The aortic valve is grossly normal. Aortic valve regurgitation is not visualized. Pulmonic Valve: The pulmonic valve was normal in structure. Pulmonic valve regurgitation is not visualized. Aorta: The ascending aorta was not well visualized. IAS/Shunts: No atrial level shunt detected by color flow Doppler. Additional Comments: 3D was performed not requiring image post processing on an independent workstation and was indeterminate.  LEFT VENTRICLE PLAX 2D LVIDd:         5.00 cm LVIDs:         2.90 cm LV PW:         1.10 cm LV IVS:        1.30 cm LVOT diam:     2.20 cm LVOT Area:     3.80 cm  LEFT ATRIUM         Index LA diam:    3.10 cm 1.01 cm/m   AORTA Ao Root diam: 2.60 cm  SHUNTS Systemic Diam: 2.20 cm Cara JONETTA Lovelace MD Electronically signed by Cara JONETTA Lovelace MD Signature Date/Time: 09/24/2024/5:14:15 PM    Final    PERIPHERAL VASCULAR CATHETERIZATION Result Date: 09/23/2024 See surgical note for result.  US  Venous Img Lower Bilateral (DVT) Addendum Date: 09/23/2024 ******** ADDENDUM  #1 ******** ADDENDUM: Study discussed by telephone with Dr Noralee at 0804 hours on 09/23/2024 ---------------------------------------------------- Electronically signed by: Helayne Hurst MD 09/23/2024 08:05 AM EST RP Workstation: [id number]   Result Date: 09/23/2024 ******** ORIGINAL REPORT ******** EXAM: ULTRASOUND DUPLEX OF THE BILATERAL LOWER EXTREMITY VEINS TECHNIQUE: Duplex ultrasound using B-mode/gray scaled imaging and Doppler spectral analysis and color flow was obtained of the deep venous structures of the bilateral lower extremity. COMPARISON:  Left lower extremity doppler ultrasound 08/22/2021. CLINICAL HISTORY: 23 year old male with history of pulmonary embolism (HCC). FINDINGS: RIGHT: The common femoral vein, femoral vein, popliteal vein, and visible calf veins of the right lower extremity demonstrate normal compressibility with normal color flow and spectral analysis. LEFT: Maintained compressibility, patency with color and spectral doppler flow in the left common femoral vein, junction with the greater saphenous vein, and profunda femoral vein. The left proximal femoral vein remains patent but the mid femoral vein is incompressible with hypoechoic thrombus (image 43) and diminished doppler flow. This clot appears nonocclusive. Distal femoral vein thrombus appears more occlusive with little residual doppler flow (images 47 and 48). The popliteal vein is distended with thrombus. Mild residual doppler flow. Visible left calf veins with similar echogenic thrombus and minimal flow. IMPRESSION: 1. Positive for subtotal occlusive deep venous thrombosis in the left lower extremity extending from the mid femoral vein into the calf veins. 2. No evidence of deep venous thrombosis in the right lower extremity. Electronically signed by: Helayne Hurst MD 09/23/2024 06:45 AM EST RP Workstation: [id number]   CT Angio Chest Pulmonary Embolism (PE) W or WO Contrast Result Date: 09/23/2024 EXAM: CTA of the Chest with  contrast for PE 09/23/2024 01:18:38 AM TECHNIQUE: CTA of the chest was performed with and without the administration of 100 mL of iohexol  (OMNIPAQUE ) 350 MG/ML intravenous contrast. Multiplanar reformatted images are provided for review. MIP images are provided for review. Automated exposure control, iterative reconstruction, and/or weight based adjustment of the mA/kV was utilized to reduce the radiation dose to as low as reasonably achievable. COMPARISON: Findings are compared to 03/01/2021. CLINICAL HISTORY: Pulmonary embolism (PE) suspected, high prob. FINDINGS: PULMONARY ARTERIES: Pulmonary arteries are adequately opacified for evaluation. Subtle pulmonary embolus. Large acute embolic burden. Central pulmonary arteries are enlarged in keeping with changes of pulmonary arterial hypertension. There is, however, no CT evidence of right heart strain. MEDIASTINUM: The heart and pericardium demonstrate no acute abnormality. Cardiac size is within normal limits. No pericardial effusion. The thoracic aorta is unremarkable. LYMPH NODES: No mediastinal, hilar or axillary lymphadenopathy. LUNGS AND PLEURA: Mild bibasilar ground-glass pulmonary infiltrate is present, asymmetrically more severe within the right lower lobe, possible reflecting changes of alveolar pulmonary edema or early exudate in the setting of pulmonary infarct. Peripherally, areas of consolidation are noted within the costophrenic sulcus bilaterally. 4 mm calcified pulmonary nodule within the right middle lobe, image 68/5. No pneumothorax or pleural effusion. No central obstructing lesion. UPPER ABDOMEN: Limited images of the upper abdomen are unremarkable. SOFT TISSUES AND BONES: No acute bone or soft tissue abnormality. The critical findings of this study were discussed directly with Dr. Suzanne by myself at 1:30 am. IMPRESSION: 1. Large acute pulmonary embolic burden without CT evidence of right heart strain; central pulmonary arteries are enlarged,  consistent with pulmonary arterial hypertension. 2. Mild bibasilar ground-glass pulmonary infiltrates, right greater than left, possibly reflecting alveolar pulmonary edema or early exudate in the setting of pulmonary infarct, with peripheral consolidations in the costophrenic sulci bilaterally. 3. The critical findings of this study were discussed directly with Dr. Suzanne by myself at 1:30 am. Electronically signed by: Dorethia Molt MD 09/23/2024 01:42 AM EST RP Workstation: [id number]   DG Chest 2 View Result Date: 09/22/2024 CLINICAL DATA:  Chest pain EXAM: CHEST - 2 VIEW COMPARISON:  Chest x-ray 03/01/2021 FINDINGS: The heart size and mediastinal contours are within normal limits. Both lungs are clear. The visualized skeletal structures are unremarkable. IMPRESSION: No active  cardiopulmonary disease. Electronically Signed   By: Greig Pique M.D.   On: 09/22/2024 22:45      Labs: BNP (last 3 results) No results for input(s): BNP in the last 8760 hours. Basic Metabolic Panel: Recent Labs  Lab 09/22/24 2156 09/24/24 0342  NA 141 137  K 4.0 4.2  CL 105 102  CO2 24 23  GLUCOSE 81 87  BUN 14 13  CREATININE 0.86 0.77  CALCIUM 9.7 8.9  MG  --  1.9   Liver Function Tests: No results for input(s): AST, ALT, ALKPHOS, BILITOT, PROT, ALBUMIN in the last 168 hours. No results for input(s): LIPASE, AMYLASE in the last 168 hours. No results for input(s): AMMONIA in the last 168 hours. CBC: Recent Labs  Lab 09/22/24 2156 09/24/24 0342 09/25/24 0912  WBC 10.0 8.2 9.9  HGB 15.3 13.2 14.2  HCT 45.8 39.4 43.3  MCV 83.1 83.5 82.5  PLT 208 171 162   Cardiac Enzymes: No results for input(s): CKTOTAL, CKMB, CKMBINDEX, TROPONINI in the last 168 hours. BNP: Invalid input(s): POCBNP CBG: Recent Labs  Lab 09/23/24 0623  GLUCAP 98   D-Dimer No results for input(s): DDIMER in the last 72 hours. Hgb A1c No results for input(s): HGBA1C in the last 72  hours. Lipid Profile No results for input(s): CHOL, HDL, LDLCALC, TRIG, CHOLHDL, LDLDIRECT in the last 72 hours. Thyroid function studies No results for input(s): TSH, T4TOTAL, T3FREE, THYROIDAB in the last 72 hours.  Invalid input(s): FREET3 Anemia work up No results for input(s): VITAMINB12, FOLATE, FERRITIN, TIBC, IRON, RETICCTPCT in the last 72 hours. Urinalysis No results found for: COLORURINE, APPEARANCEUR, LABSPEC, PHURINE, GLUCOSEU, HGBUR, BILIRUBINUR, KETONESUR, PROTEINUR, UROBILINOGEN, NITRITE, LEUKOCYTESUR Sepsis Labs Recent Labs  Lab 09/22/24 2156 09/24/24 0342 09/25/24 0912  WBC 10.0 8.2 9.9   Microbiology Recent Results (from the past 240 hours)  MRSA Next Gen by PCR, Nasal     Status: None   Collection Time: 09/23/24  6:22 AM   Specimen: Nasal Mucosa; Nasal Swab  Result Value Ref Range Status   MRSA by PCR Next Gen NOT DETECTED NOT DETECTED Final    Comment: (NOTE) The GeneXpert MRSA Assay (FDA approved for NASAL specimens only), is one component of a comprehensive MRSA colonization surveillance program. It is not intended to diagnose MRSA infection nor to guide or monitor treatment for MRSA infections. Test performance is not FDA approved in patients less than 11 years old. Performed at [facility], [address] [place], [place] [address]      Total time spend on discharging this patient, including the last patient exam, discussing the hospital stay, instructions for ongoing care as it relates to all pertinent caregivers, as well as preparing the medical discharge records, prescriptions, and/or referrals as applicable, is *** minutes.    Ellouise Haber, MD  [facility] 09/26/2024, 11:04 AM

## 2024-09-26 NOTE — Plan of Care (Signed)
  Problem: Clinical Measurements: Goal: Ability to maintain clinical measurements within normal limits will improve Outcome: Progressing Goal: Respiratory complications will improve Outcome: Progressing   Problem: Nutrition: Goal: Adequate nutrition will be maintained Outcome: Progressing   Problem: Elimination: Goal: Will not experience complications related to urinary retention Outcome: Progressing

## 2024-09-27 ENCOUNTER — Telehealth (INDEPENDENT_AMBULATORY_CARE_PROVIDER_SITE_OTHER): Payer: Self-pay

## 2024-09-27 LAB — PROTEIN S, TOTAL: Protein S Ag, Total: 100 % (ref 60–150)

## 2024-09-27 LAB — HEXAGONAL PHASE PHOSPHOLIPID: Hexagonal Phase Phospholipid: 3 s (ref 0–11)

## 2024-09-27 LAB — LUPUS ANTICOAGULANT PANEL
DRVVT: 33 s (ref 0.0–47.0)
PTT Lupus Anticoagulant: 54 s — ABNORMAL HIGH (ref 0.0–43.5)

## 2024-09-27 LAB — PTT-LA MIX: PTT-LA Mix: 47.8 s — ABNORMAL HIGH (ref 0.0–40.5)

## 2024-09-27 LAB — PROTEIN S ACTIVITY: Protein S Activity: 110 % (ref 63–140)

## 2024-09-27 LAB — PROTEIN C ACTIVITY: Protein C Activity: 95 % (ref 73–180)

## 2024-09-27 NOTE — ED Provider Notes (Signed)
 [facility] EMERGENCY DEPT  ED Provider Note History   Chief Complaint  Patient presents with   Chest Pain    History of Present Illness Todd Ward is a 23 year old male with a history of deep vein thrombosis and pulmonary embolism who presents with sharp chest pain and fever.  He reports sharp pleuritic chest pain rated 12/10 since Saturday, located in the chest and radiating to the left shoulder, persistent since discharge from the hospital on Sunday. Percocet provides about four hours of relief, and he uses Tylenol  for milder pain.  He had knee surgery in 2023 complicated by pneumonia and a leg clot. More recently he developed exertional faintness, neck throbbing, and auditory changes, and imaging showed a clot near his heart. He underwent thrombectomy on December 4th with about 70% of the clot removed, and residual clots remain in his leg without planned further intervention.  He is taking Eliquis  10 mg twice daily for seven days, then 5 mg, and has not missed any doses.  He notes dark urine and reports elevated blood pressure over the weekend up to 157/110. He denies nausea, vomiting, cough, sore throat, or urinary pain or frequency.  He has developed fever since arriving to the ED, after being afebrile over the weekend. He denies vision changes, generalized tingling, or weakness, aside from tingling in the operative knee. HPI Level of Interpreter Services: No interpreter needed (no language barrier)  Past Medical History:  Diagnosis Date   Diabetes mellitus without complication (CMS/HHS-HCC)    Hyperlipidemia    Hypertension    Past Surgical History:  Procedure Laterality Date   Arthoscopy knee, lateral release, loose nody removal, left Left 02/16/2016   Dr. Kathlynn   Left knee tibial tubercle osteotomy, medial patellofemoral ligament reconstruction using allograft, open lateral retinacular release, chondroplasty of trochlea Left 02/19/2021   Dr. Tobie   Family  History  Problem Relation Age of Onset   No Known Problems Mother    Diabetes Father    High blood pressure (Hypertension) Father    Social History   Socioeconomic History   Marital status: Single  Occupational History   Occupation: Producer, Television/film/video and [employer]  Tobacco Use   Smoking status: Never    Passive exposure: Never   Smokeless tobacco: Never  Vaping Use   Vaping status: Never Used  Substance and Sexual Activity   Alcohol use: No    Alcohol/week: 0.0 standard drinks of alcohol   Drug use: No   Sexual activity: Never  Social History Narrative   Religious Affiliation: Sherlean    Social Drivers of Health   Financial Resource Strain: Low Risk  (11/13/2023)   Overall Financial Resource Strain (CARDIA)    Difficulty of Paying Living Expenses: Not hard at all  Food Insecurity: No Food Insecurity (11/13/2023)   Hunger Vital Sign    Worried About Running Out of Food in the Last Year: Never true    Ran Out of Food in the Last Year: Never true  Transportation Needs: No Transportation Needs (09/24/2024)   Received from [facility] - Transportation    In the past 12 months, has lack of transportation kept you from medical appointments or from getting medications?: No    In the past 12 months, has lack of transportation kept you from meetings, work, or from getting things needed for daily living?: No   Received from [employer]   Social Network  Housing Stability: Low Risk  (08/23/2024)   Housing  Stability Vital Sign    Unable to Pay for Housing in the Last Year: No    Number of Times Moved in the Last Year: 0    Homeless in the Last Year: No   Review of Systems  Physical Exam  BP 126/76   Pulse 100   Temp (!) 38 C (100.4 F) (Tympanic)   Resp 22   SpO2 93%  Physical Exam Physical Exam GENERAL: Alert, well developed, well nourished, no acute distress. CHEST: Lung sounds diminished in bilateral basal fields. CARDIOVASCULAR: Borderline  tachycardic, regular rhythm. ABDOMEN: Soft, non-tender, no CVA tenderness. EXTREMITIES: No pitting edema in lower extremities.  Procedures  Procedures   Results RADIOLOGY Chest CT: Acute saddle pulmonary embolus (09/23/2024)  PATHOLOGY Thrombectomy: 70% clot removal from groin access (09/23/2024) Medical Decision Making   Medical Decision Making 23 year old male with history of left lower extremity DVT and recent acute saddle pulmonary embolism status post thrombectomy presents with severe, sharp, pleuritic chest pain and fever. He is currently on Eliquis  with no missed doses and reports persistent pain despite Percocet and Tylenol . Exam reveals diminished lung sounds bilaterally, borderline tachycardia, and no lower extremity edema. No nausea, vomiting, or neurological deficits noted.  Differential diagnosis includes, but is not limited to: - Post-procedural infection/septic emboli: Recent vascular intervention and fever raise concern for infection within blood vessels or septic emboli. - Recurrent or residual pulmonary embolism: Persistent severe pleuritic chest pain following thrombectomy and known residual clots suggest possible new or ongoing embolic events.  Acute saddle pulmonary embolism status post thrombectomy with concern for infection or recurrent embolism - Order CT of the chest - Order blood cultures, CBC, CMP, pro-BNP, and lactate - Order viral URI swabs - Administer fluids - Continue Eliquis  as prescribed - Administered Percocet and Tylenol  for pain    Medical Complexity:    New and requires workup.     Pertinent labs & imaging results that were available during my care of the patient were reviewed by me and considered in my medical decision making.     I reviewed previous medical records.     I independently visualized image(s), tracing(s), and/or specimen(s).         ECG   I discussed the patient with another provider.    ED Course as of 09/27/24 1817  Mon  Sep 27, 2024  1528 ECG with NSR and Q3T3  [AB]  1654 hsTnI Interpretation: Rule-Out Troponin ruled out [AC]  1654 POC Influenza A RNA: Not Detected [AC]  1654 POC Influenza B RNA: Not Detected [AC]  1654 Coronavirus (COVID-19) SARS-CoV-2 PCR: Not Detected [AC]  1654 Pro-Brain Natriuretic Peptide, N-terminal (NT-Pro-BNP): <36 [AC]  1654 WBC(!): 10.8 [AC]  1655 CT chest PE protocol incl CT angiogram chest w wo contrast Limited evaluation secondary to patient body habitus and photon starvation.   Acute pulmonary embolism, most proximally involved the distal bilateral right and left main pulmonary arteries. No definite CT evidence of right heart strain. Recommend correlation with echocardiogram.   Low lung volumes with bibasilar atelectasis and trace left pleural effusion.   [AC]  1816 General medicine consulted for admission - concern for developing pneumonia with questionable RLL opacity and fever. Reports that he is only able to pull 700 cc on his incentive spirometer, which increases risk for infection.  [AC]    ED Course User Index [AB] Patterson Darin Booker, PA [AC] Bluford Honour, [place]      Medications Administered in the Emergency Department   lidocaine  (XYLOCAINE )  1 % injection 0.5 mL (has no administration in time range) dextrose  50% in water solution 12.5-25 g (has no administration in time range) glucagon (GLUCAGEN) injection 1 mg (has no administration in time range) vancomycin  (VANCOCIN ) 3 g in sodium chloride  0.9% 530 mL IVPB (3 g Intravenous New Bag 09/27/24 1513) piperacillin-tazobactam (ZOSYN) 3.375 g in sodium chloride  0.9 % 100 mL IVPB (0 g Intravenous Stopped 09/27/24 1500) sodium chloride  0.9 % bolus 2,466 mL (2,466 mLs Intravenous New Bag 09/27/24 1412) acetaminophen  (TYLENOL ) tablet 975 mg (975 mg Oral Given 09/27/24 1513)   ED Clinical Impression  1. Chest pain, unspecified type               This note has been created using automated tools and reviewed for  accuracy by DARIN ANGELIA BUTTNER.   AI Note Feedback-- Point of Care Survey   Buttner Darin Angelia, [place] 09/27/24 [phone]

## 2024-09-27 NOTE — ED Provider Notes (Signed)
 Initial physician assessment conducted in Triage  HPI: Pt is a 23 y.o. male who presents with complaints of chest pain 10/10. Recent thrombectomy v R groin access for submassive PE. On loading dose of Eliquis , took 10 mg this morning. Tachycardia and fever in triage.  ROS: As per HPI, All other systems reviewed and are negative.   Past Medical History:  Diagnosis Date   Diabetes mellitus without complication (CMS/HHS-HCC)    Hyperlipidemia    Hypertension     Vitals:   09/27/24 1325  BP: (!) 146/80  Pulse: 98  Resp: 22  Temp: (!) 38 C (100.4 F)    Physical Exam  On exam pt is alert and oriented in no acute distress Head atraumatic, normocephalic Eyes clear conjunctiva with EOMI MM moist Neck symmetric with FROM Lungs CTA bilaterally with no wheezing rhonchi or rales Heart tachyc, no GMR Abd soft, non-tender, non-distended, no guarding, rebound or rigidity Ext with no edema Skin warm, free of rash Neuro Alert and oriented x3, follows commands, speech fluid, symmetric movement and sensation  Plan: Sepsis evaluation CT chest - pe evaluation  Medical Evaluation at Triage I have initiated the patient's assessment and diagnostic evaluation. The patient will be placed in the appropriate treatment space, once one is available to complete the evaluation and treatment.  I have discussed the plan of care with the patient.  Franky Hering, MD Assistant Professor Emergency Medicine       Hering Franky Clara, MD 09/27/24 [phone]

## 2024-09-27 NOTE — ED Notes (Signed)
 MD notified of PT increased SOB, O2 between 93-95 at this time. No new orders.

## 2024-09-27 NOTE — Telephone Encounter (Signed)
 Patient verbalized understanding and stated that he is currently at [facility] being evaluated

## 2024-09-27 NOTE — Progress Notes (Signed)
°                 Pharmacokinetic Monitoring Note               Todd Ward is a 23 y.o. male who is starting vancomycin  therapy for pneumonia-nosocomial, with a goal AUC 400-600 mg*h/L. The patient already received a loading dose of 3000 mg (approximately 15 mg/kg).  Pertinent Objective Data: Ht:   Wt:   Ideal body weight: 82.2 kg (181 lb 3.5 oz) Adjusted ideal body weight: 131.7 kg (290 lb 5.3 oz)   Recent Labs  Lab 09/27/24 1412  WBC 10.8*  BUN 11  CREATININE 0.7  Estimated Creatinine Clearance: 165 mL/min  Assessment/Plan: Based on this information the following dose will be started: Vancomycin  1750 mg q12h   Thank you for the consult. A clinical pharmacist will continue to monitor the patient's renal function, cultures/sensitivities, clinical response to therapy, and need for levels during this admission per hospital protocol. For additional questions, please page the covering pharmacist.  GREIG CARY, PharmD

## 2024-09-27 NOTE — H&P (Signed)
 Hospital Medicine Admission History & Physical  Time of Service: 09/27/2024, 10:06 PM  PCP: Alla Amis, MD, Phone [phone], Fax [phone]   Chief Complaint  Persistent left sided chest pain  History of Present Illness  Todd Ward is a 23 y.o. male with hx of LLE DVT 2022 and another LLE DVT along with submassive PE Dec 4th 2025 s/p catheter directed TPA and mechanical PE thrombectomy 09/23/24, presenting with persistent left sided pleuritic chest pain.  As stated, pt developed chest pain and presented to ED on 12/3, and was diagnosed with large PE and LLE DVT. Due to size of PE, vascular surgeon Dr. Cordella Shawl performed catheter directed thrombolysis and mechanical thrombectomy on 09/23/24. TTE on 12/5 showed normal RV function and size, and repeat CTPE on 12/6 showed less clot burden and smaller PA in diameter (2.6cm from 3.6cm). he was discharged home on 12/7 with apixaban  load then maintenance along with percocet for pain. He had been persistently having left sided pleuritic chest pain during hospitalization, and this persisted when he went home and was very limiting with his breathing. He called the vascular surgery office who instructed him to come to ED. In ED here, repeat CTPE done demonstrating PE most proximal in distal bilateral right/left main pulmonary arteries. In addition, Tmax 38.0 with wbc 10.8K. he had blood cultures drawn and was given vanc and piptazo and admitted due to concern for hospital acquired pneumonia.   He says when pain controlled, he can pull 1750-2038ml on his incentive spirometer, however when pain is severe he can only pull .  In addition the left sided pleuritic chest pain, he has also had productive cough with white phlegm. No fevers at home since discharge. No chills. No runny nose. No abd pain. No BM since Fri/Sat. No blood in urine or stool. No asymmetric leg swelling. He has had some inner thigh pain on left leg.   Medical  History  Past Medical History Past Medical History:  Diagnosis Date   Diabetes mellitus without complication (CMS/HHS-HCC)    Hyperlipidemia    Hypertension    Left leg DVT (CMS/HHS-HCC) 09/2024   Pulmonary embolism (CMS/HHS-HCC) 09/2024    Past Surgical History Past Surgical History:  Procedure Laterality Date   Arthoscopy knee, lateral release, loose nody removal, left Left 02/16/2016   Dr. Kathlynn   Left knee tibial tubercle osteotomy, medial patellofemoral ligament reconstruction using allograft, open lateral retinacular release, chondroplasty of trochlea Left 02/19/2021   Dr. 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[employer]  Tobacco Use   Smoking status: Never    Passive exposure: Never   Smokeless tobacco: Never  Vaping Use   Vaping status: Never Used  Substance and Sexual Activity   Alcohol use: No    Alcohol/week: 0.0 standard drinks of alcohol   Drug use: No   Sexual activity: Never  Social History Narrative   Religious Affiliation: Sherlean    Social Drivers of Health   Financial Resource Strain: Low Risk  (11/13/2023)   Overall Financial Resource Strain (CARDIA)    Difficulty of Paying Living Expenses: Not hard at all  Food Insecurity: No Food Insecurity (11/13/2023)   Hunger  Vital Sign    Worried About Programme Researcher, Broadcasting/film/video in the Last Year: Never true    Ran Out of Food in the Last Year: Never true  Transportation Needs: No Transportation Needs (09/24/2024)   Received from [facility] - Transportation    In the past 12 months, has lack of transportation kept you from medical appointments or from getting medications?: No    In the past 12 months, has  lack of transportation kept you from meetings, work, or from getting things needed for daily living?: No    Allergies & Medications  No Known Allergies  Medications Prior to Admission Medications  Prescriptions Last Dose Taking?  acetaminophen  (TYLENOL ) 325 MG tablet  Yes  Sig: Take 650 mg by mouth  apixaban  (ELIQUIS ) 5 mg tablet  Yes  Sig: Take 10 mg by mouth 2 (two) times daily  melatonin 2.5 mg Chew  No  Sig: Take by mouth at bedtime as needed  naproxen sodium (ALEVE) 220 MG tablet  No  Sig: Take 220 mg by mouth 2 (two) times daily as needed for Pain  oxyCODONE -acetaminophen  (PERCOCET) 5-325 mg tablet  Yes  Sig: Take 1-2 tablets by mouth    Facility-Administered Medications: None     Review of Systems  A complete review of systems was performed and is negative except as reviewed in the HPI.  Physical Exam    Current Vital Signs 24h Vital Sign Ranges  T 37.7 C (99.9 F) (09/27/24 2135) Temp  Avg: 37.6 C (99.7 F)  Min: 37.1 C (98.8 F)  Max: 38 C (100.4 F)  BP 139/81 (09/27/24 2135) BP  Min: 103/40  Max: 146/80  HR 100 (09/27/24 2135) Pulse  Avg: 95.7  Min: 89  Max: 100  RR 20 (09/27/24 2135) Resp  Avg: 26.3  Min: 20  Max: 36  O2sat 95 %   SpO2  Avg: 93.1 %  Min: 90 %  Max: 95 %  Weight     There is no height or weight on file to calculate BMI. General: alert, cooperative, in NAD, 95% on room air Eyes: PERRL, conjunctiva clear, anicteric sclera HENT: oropharynx clear, moist mucous membranes Neck: no adenopathy and no JVD CV: HR ~100, regular, without murmurs, rubs or gallops Resp: mild crackles left lower lung field, otherwise clear Abd: soft, nontender, nondistended, normoactive bowel sounds  Ext: symmetric mild calf edema Skin: no rashes or lesions Psych: oriented to time, place and person, mood and affect are appropriate Neuro: CN II-XII intact. Grossly normal and symmetric strength in upper and lower extremities. Intact sensation to light touch  throughout.  Data   Recent Results (from the past 24 hours)  ECG 12-lead   Collection Time: 09/27/24 12:54 PM  Result Value Ref Range   Vent Rate (bpm) 96    PR Interval (msec) 164    QRS Interval (msec) 74    QT Interval (msec) 320    QTc (msec) 404   ECG 12-lead   Collection Time: 09/27/24  1:38 PM  Result Value Ref Range   Vent Rate (bpm) 93    PR Interval (msec) 168    QRS Interval (msec) 76    QT Interval (msec) 326    QTc (msec) 405   Comprehensive Metabolic Panel (CMP)   Collection Time: 09/27/24  2:12 PM  Result Value Ref Range   Sodium 133 (L) 135 - 145 mmol/L   Potassium 4.2 3.5 - 5.0 mmol/L   Chloride 98  98 - 108 mmol/L   Carbon Dioxide (CO2) 25 21 - 30 mmol/L   Urea Nitrogen (BUN) 11 7 - 20 mg/dL   Creatinine 0.7 0.6 - 1.3 mg/dL   Glucose 92 70 - 859 mg/dL   Calcium 9.3 8.7 - 89.7 mg/dL   AST (Aspartate Aminotransferase) 41 15 - 41 U/L   ALT (Alanine Aminotransferase) 60 (H) 15 - 50 U/L   Bilirubin, Total 1.1 0.4 - 1.5 mg/dL   Alk Phos (Alkaline Phosphatase) 126 (H) 24 - 110 U/L   Albumin 3.4 (L) 3.5 - 4.8 g/dL   Protein, Total 7.6 6.2 - 8.1 g/dL   Anion Gap 10 3 - 12 mmol/L   BUN/CREA Ratio 16 6 - 27   Glomerular Filtration Rate (eGFR)  132 mL/min/1.73sq m  Complete Blood Count (CBC) with Differential   Collection Time: 09/27/24  2:12 PM  Result Value Ref Range   WBC (White Blood Cell Count) 10.8 (H) 3.2 - 9.8 x109/L   Hemoglobin 14.2 13.7 - 17.3 g/dL   Hematocrit 58.0 60.9 - 49.0 %   Platelets 190 150 - 450 x109/L   MCV (Mean Corpuscular Volume) 83 80 - 98 fL   MCH (Mean Corpuscular Hemoglobin) 28.0 26.5 - 34.0 pg   MCHC (Mean Corpuscular Hemoglobin Concentration) 33.9 31.5 - 36.3 %   RBC (Red Blood Cell Count) 5.07 4.37 - 5.74 x1012/L   RDW-CV (Red Cell Distribution Width) 12.9 11.5 - 14.5 %   NRBC (Nucleated Red Blood Cell Count) 0.00 0 x109/L   NRBC % (Nucleated Red Blood Cell %) 0.0 %   MPV (Mean Platelet Volume) 10.0 7.2 - 11.7 fL    Neutrophil Count 8.0 2.0 - 8.6 x109/L   Neutrophil % 74.1 37 - 80 %   Lymphocyte Count 1.4 0.6 - 4.2 x109/L   Lymphocyte % 13.3 10 - 50 %   Monocyte Count 0.9 0 - 0.9 x109/L   Monocyte % 8.5 0 - 12 %   Eosinophil Count 0.24 0 - 0.70 x109/L   Eosinophil % 2.2 0 - 7 %   Basophil Count 0.06 0 - 0.20 x109/L   Basophil % 0.6 0 - 2 %   Immature Granulocyte Count 0.14 (H) <=0.06 x109/L   Immature Granulocyte % 1.3 (H) <=0.7 %  Prothrombin Time (INR)   Collection Time: 09/27/24  2:12 PM  Result Value Ref Range   Prothrombin Time 19.6 (H) 9.5 - 13.1 sec   Prothrombin INR 1.7 (H) 0.9 - 1.1  Activated Partial Thromboplastin Time (APTT)   Collection Time: 09/27/24  2:12 PM  Result Value Ref Range   Act Partial Thromboplastin Time 46.2 (H) 26.8 - 37.1 sec  Lactate, Plasma (Lactic Acid) Venous   Collection Time: 09/27/24  2:12 PM  Result Value Ref Range   Lactate, Blood 1.1 0.6 - 2.2 mmol/L  Troponin I, High Sensitivity ED Evaluation (hsTnI)   Collection Time: 09/27/24  2:12 PM  Result Value Ref Range   hsTnI Baseline 5 ng/L   hsTnI Interpretation Indeterminate Rule-Out, Indeterminate   Symptom Duration >=3 hours   Pro-Brain Natriuretic peptide, N-Terminal (NT-pro-BNP)   Collection Time: 09/27/24  2:12 PM  Result Value Ref Range   Pro-Brain Natriuretic Peptide, N-terminal (NT-Pro-BNP) <36 <=125 pg/mL  C-Reactive Protein (CRP), Inflammatory   Collection Time: 09/27/24  2:12 PM  Result Value Ref Range   CRP (C-reactive Protein, Inflammatory) 20.77 (H) <=0.85 mg/dL  POC GLUCOSE   Collection Time: 09/27/24  2:21 PM  Result Value Ref  Range   POC Glucose 94 70 - 140 mg/dL  Troponin I, High Sensitivity One Hour (hsTnI)   Collection Time: 09/27/24  3:11 PM  Result Value Ref Range   hsTnI 1 hr 7 ng/L   Delta from baseline 2    hsTnI Interpretation Rule-Out Rule-Out, Indeterminate  ECG 12-lead   Collection Time: 09/27/24  3:34 PM  Result Value Ref Range   Vent Rate (bpm) 88    PR  Interval (msec) 168    QRS Interval (msec) 70    QT Interval (msec) 344    QTc (msec) 416   POC Coronavirus (COVID-19) SARS-Cov-2 Rapid Test   Collection Time: 09/27/24  3:51 PM  Result Value Ref Range   POC Coronavirus (COVID-19) SARS-CoV-2 Rapid Test Not Detected Not Detected  POC Influenza A and/or B Rapid Assay   Collection Time: 09/27/24  3:51 PM  Result Value Ref Range   POC Influenza A RNA Not Detected Not Detected   POC Influenza B RNA Not Detected Not Detected    EKG: Normal sinus rhythm  Normal ECG   Radiology Studies on Admission:  CTPE prelim read Limited evaluation secondary to patient body habitus and photon starvation.   Acute pulmonary embolism, most proximally involved the distal bilateral right and left main pulmonary arteries. No definite CT evidence of right heart strain. Recommend correlation with echocardiogram.   Low lung volumes with bibasilar atelectasis and trace left pleural effusion.   Assessment & Plan  Todd Ward is a 23 y.o. male admitted for the following problems: Principal Problem:   Pleuritic chest pain Active Problems:   Morbid obesity due to excess calories (CMS-HCC)   History of DVT of lower extremity (left; 02/2021) completed 6 month course of xarelto   History of pulmonary embolism   Todd Ward is a 23 y.o. male with hx of LLE DVT 2022 and another LLE DVT along with submassive PE Dec 4th 2025 s/p catheter directed TPA and mechanical PE thrombectomy 09/23/24, presenting with persistent left sided pleuritic chest pain.  #Persistent left pleuritic chest pain #Very recent submassive PE (09/23/24) s/p catheter directed thrombolysis and mechanical thrombectomy #LLE DVT (subtotal occlusive deep venous thrombosis in the left lower extremity extending from the mid femoral vein into the calf veins. ) Ongoing pleuritic chest pain and cough could be 2/2 PE and pulmonary infarct and splinting. However, with tmax 38.0C, wbc 10.8K  and CRP 20, bacterial pneumonia not ruled out.  - cont home apixaban  10mg  BID - cont vanc and piptazo for now pending further infectious workup - procalcitonin - CRP - MRSA nares - percocet 2 tabs q4hr prn for pain - IV dilaudid  0.5mg  q3hr prn for breakthrough pain - incentive spirometer at bedside and patient actively using  #Elevated ALT and alk phos Intermittently elevated in past - trend as outpatient  #Obesity - previously on zepbound, off x2-43months - outpt followup    Comorbid Conditions    VTE Prophylaxis  + Anticoagulant Ordered  apixaban , 10 mg, Oral, Q12H [facility]      Code Status: Full Code  Patient Class & Status: Observation,   Discharge Planning: TBD    ARTHEA LYNWOOD SOW, MD  [facility] 09/27/2024 10:06 PM

## 2024-09-27 NOTE — ED Triage Notes (Addendum)
 Pt had thrombectomy at [facility] on Thursday for PE, re-presents for care today due to ongoing chest pain. Per pt they got about 70% of the clot but there are still clots remaining in my leg. Pt A/Ox4, in wheelchair.  Past Medical History:  Diagnosis Date   Diabetes mellitus without complication (CMS/HHS-HCC)    Hyperlipidemia    Hypertension

## 2024-09-27 NOTE — Telephone Encounter (Signed)
 Patient left a message on the nurse stating that he having pain chest pain on the left side at a 10 out 10 unless he is taking the percocet. Patient was advise to go the ED for further evaluation. He ask about the results with CT from his understanding he has collapsed lung. Patient requesting for the results to get a better understanding. Please Advise

## 2024-09-27 NOTE — Progress Notes (Signed)
 Sepsis Risk Note  NATOSHA NOGA                 Date of Service: 09/27/2024 3:11 PM  Patient: Todd Ward Room: D18/D18  [name] was recognized as being at high risk for sepsis at 1511.   Sepsis bundle elements that have been met include: Blood cultures, Lactate, Antibiotics, and IVF   The Sepsis Watch designated nurse will not continue to follow at this time.

## 2024-09-28 LAB — PROTHROMBIN GENE MUTATION

## 2024-09-28 NOTE — ED Notes (Signed)
 Pt rounded on att. Toradol  given per order. No needs expressed, call bell in reach.

## 2024-09-28 NOTE — ED Notes (Signed)
 Patient resting, respirations even and unlabored, no apparent distress.

## 2024-09-28 NOTE — ED Notes (Signed)
 Pt given medications per orders att including prn dilaudid  for 10/10 left sided chest pain. Pt assisted to sit at edge of bed, patient requesting to stay at edge of bed for time being. Call bell in reach, bed in low and locked position.

## 2024-09-28 NOTE — Progress Notes (Signed)
 Case Manager Discharge Summary / Closing Note  Expected Discharge Date & Time: 09/28/2024 at 2 pm to 5 pm  Discharge Plan:  Patient is discharging home with routine care and no post-acute services were indicated prior to discharge.    Post-Acute Services Coordinated: No resources indicated at this time  Funding for Discharge Medications: Drug assistance not needed    Transportation: Arrangements: patient arranged Discharge Transportation: private vehicle  Final ADT:  Final ADT Discharge Disposition: Home Based  Home Based: Home or Self Care                 Second IMM Received: Not indicated (09/28/24 1453)  Final Summary:   Todd Ward

## 2024-09-28 NOTE — ED Notes (Signed)
 AVS provided and discharge instructions reviewed. Pt verbalized understanding of teaching. Pt vitals stable and PIV removed.

## 2024-09-28 NOTE — ED Notes (Signed)
 Pt rounded on att. No needs expressed, call bell in reach.

## 2024-09-29 LAB — FACTOR 5 LEIDEN

## 2024-09-29 NOTE — Progress Notes (Signed)
 Chief Complaint  Patient presents with   Hospital Follow Up    HPI  History of Present Illness Todd Ward is a 23 year old male with recurrent blood clots who presents for management of his anticoagulation therapy after hospitalization at [facility] and [facility] 12/3-12/6.  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, diagnosed by ultrasound at [facility] - Currently taking Eliquis  5 mg twice daily; initially started on 10 mg twice daily in the hospital - Seeking assistance with Eliquis  cost ($60/month); exploring manufacturer coupons and insurance coverage - In process of scheduling genetic workup at [facility] for hereditary clotting disorders  Pulmonary infarct and pulmonary hypertension - Recent hospitalization at [facility] for pulmonary infarct - Diagnosed with pulmonary hypertension - Experiences pain that radiates with breathing, described as sharp and shooting (evaluated in ER at [facility] 09/27/24)  Sleep-disordered breathing - Snoring and loud breathing during sleep - No daytime drowsiness or witnessed apneas - Referred for sleep study and cardiology follow-up  Activity level and mobility - Sedentary during Thanksgiving weekend - Generally stands all day at work - Active and walking since Thanksgiving  ROS Review of systems is unremarkable for any active cardiac, respiratory, GI, GU, hematologic, neurologic, dermatologic, HEENT, or psychiatric symptoms except as noted above.  No fevers, chills, or constitutional symptoms.   Current Outpatient Medications  Medication Sig Dispense Refill   acetaminophen  (TYLENOL ) 325 MG tablet Take 650 mg by mouth every 8 (eight) hours as needed     apixaban  5 mg (74 tabs) DsPk Take as directed on package: start with two-5mg  tablets twice daily for 7 days. On day 8, switch to one-5mg  tablet twice daily.     HYDROmorphone  (DILAUDID ) 2 MG tablet Take  1-2 tablets (2-4 mg total) by mouth every 4 (four) hours as needed for Pain for up to 5 days 45 tablet 0   ibuprofen (MOTRIN) 600 MG tablet Take 1 tablet (600 mg total) by mouth every 6 (six) hours as needed for Pain for up to 10 days     melatonin 2.5 mg Chew Take by mouth at bedtime as needed     No current facility-administered medications for this visit.    Allergies as of 09/29/2024   (No Known Allergies)    Patient Active Problem List  Diagnosis   Morbid obesity due to excess calories (CMS-HCC)   History of DVT of lower extremity (left; 02/2021 & 09/2024)   History of pulmonary embolism (09/2024) on eliquis    Pleuritic chest pain    Past Medical History:  Diagnosis Date   Diabetes mellitus without complication (CMS/HHS-HCC)    Hyperlipidemia    Hypertension    Left leg DVT (CMS/HHS-HCC) 09/2024   Pulmonary embolism (CMS/HHS-HCC) 09/2024    Past Surgical History:  Procedure Laterality Date   Arthoscopy knee, lateral release, loose nody removal, left Left 02/16/2016   Dr. Kathlynn   Left knee tibial tubercle osteotomy, medial patellofemoral ligament reconstruction using allograft, open lateral retinacular release, chondroplasty of trochlea Left 02/19/2021   Dr. Tobie    Vitals:   09/29/24 1110  BP: 138/70  Pulse: 81  SpO2: 97%  Weight: (!) 204.1 kg (450 lb)  Height: 188 cm (6' 2)  PainSc:   4  PainLoc: Chest   Body mass index is 57.78 kg/m.  Exam  General. Well appearing morbidly obese male accompanied by significant other; NAD; VS reviewed     Eyes. Sclera and  conjunctiva clear; Vision grossly intact; extraocular movements intact Neck. Supple.  Lungs. Respirations unlabored; decrease BS on the LLL compared to the right Cardiovascular. Heart regular rate and rhythm without murmurs, gallops, or rubs Skin. Normal color and turgor Neurologic. 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 Currently on Eliquis  5 mg twice daily. Genetic hypercoagulable workup pending to assess for hereditary clotting disorders. Lifelong anticoagulation therapy is necessary due to recurrent clots. Eliquis  is preferred over Coumadin due to its ease of use and lack of dietary restrictions. The genetic workup will not change current management but will inform future children of potential risk factors. - Continue Eliquis  5 mg twice daily. - Schedule genetic hypercoagulable workup at [facility]. - Coordinate with pharmacy for Eliquis  manufacturer coupon options. - Ensure continuous supply of Eliquis ; contact provider if refill is needed.  Pulmonary infarct Recent pulmonary infarct likely secondary to pulmonary embolism. Symptoms include sharp chest pain, likely referred pain from ischemia due to clot obstruction. Decreased breath sounds in the left lower lung. Recovery expected over the next six months as the body absorbs the clot. - Continue anticoagulation therapy with Eliquis  (anticipate lifelong tx)  Pulmonary hypertension Diagnosed with pulmonary hypertension, possibly secondary to sleep apnea or pulmonary embolism. Referral to cardiology for further evaluation and management. - Follow up with cardiology for pulmonary hypertension management.   Medications allergies reviewed and reconciled.  Hospital notes, labs, and studies reviewed.  Transitional phone call complete within 48 hours of discharge.  F/u as scheduled or sooner prn  ALDA CARPEN, MD

## 2024-10-01 ENCOUNTER — Telehealth (INDEPENDENT_AMBULATORY_CARE_PROVIDER_SITE_OTHER): Payer: Self-pay

## 2024-10-01 NOTE — Telephone Encounter (Signed)
 Mariam, nurse case manager from [facility] called at this time and stated she was his case manager if she could be of help for any of patients care coordination to reach out to her.

## 2024-10-11 ENCOUNTER — Ambulatory Visit (INDEPENDENT_AMBULATORY_CARE_PROVIDER_SITE_OTHER): Payer: Self-pay | Admitting: Vascular Surgery

## 2024-10-11 ENCOUNTER — Other Ambulatory Visit (HOSPITAL_COMMUNITY): Payer: Self-pay

## 2024-10-11 ENCOUNTER — Other Ambulatory Visit: Payer: Self-pay

## 2024-10-11 ENCOUNTER — Encounter (INDEPENDENT_AMBULATORY_CARE_PROVIDER_SITE_OTHER): Payer: Self-pay | Admitting: Vascular Surgery

## 2024-10-11 VITALS — BP 131/90 | HR 69 | Resp 18 | Ht 75.0 in | Wt >= 6400 oz

## 2024-10-11 DIAGNOSIS — J189 Pneumonia, unspecified organism: Secondary | ICD-10-CM | POA: Diagnosis not present

## 2024-10-11 DIAGNOSIS — I2692 Saddle embolus of pulmonary artery without acute cor pulmonale: Secondary | ICD-10-CM | POA: Diagnosis not present

## 2024-10-11 DIAGNOSIS — I82462 Acute embolism and thrombosis of left calf muscular vein: Secondary | ICD-10-CM | POA: Diagnosis not present

## 2024-10-11 DIAGNOSIS — E119 Type 2 diabetes mellitus without complications: Secondary | ICD-10-CM

## 2024-10-11 NOTE — Progress Notes (Unsigned)
 "        MRN : [id number]  Todd Ward is a 23 y.o. ([birth year]) male who presents with chief complaint of legs hurt and swell.  History of Present Illness:  The patient presents to the office for follow-up evaluation status post pulmonary thrombectomy.  Thrombectomy was performed at at [facility] on 09/23/2024.  PE was identified by CT angiogram dated 09/23/2024.  Subsequently duplex ultrasound of the lower extremity venous system bilaterally demonstrated in the left mid to distal superficial femoral vein and popliteal veins.  This thrombus extended into the calf veins as well.  The presenting symptoms were pleuritic chest pain and profound SOB.  CT angiogram demonstrated a large volume of emboli with significant right heart strain.  The patient notes resolution of the shortness of breath symptoms.  However he does have persistent pain describing it in the left shoulder as well and now most recently in the substernal chest pain.  He did go to the [facility] emergency room with his complaints of pain was admitted for a very short stay and treated for a pneumonia.  The patient denies persisting cough or hemoptysis.  The patient has been following with Dr. Melanee and is scheduled for a hypercoagulable panel February 14, 2025.  He has secured his Eliquis  for at least 3 more months with assistance.  The patient has not been using compression therapy at this point.  The patient denies significant leg pain and swelling dependency.  The patient notes minimal edema in the morning.    No blood per rectum or blood in any sputum.  No excessive bruising per the patient.   No recent shortening of the patient's walking distance or new symptoms consistent with claudication.  No history of rest pain symptoms. No new ulcers or wounds of the lower extremities have occurred.  The patient denies amaurosis fugax or recent TIA symptoms. There are no recent neurological changes noted. No recent episodes of angina or shortness of  breath documented.    Active Medications[1]  Past Medical History:  Diagnosis Date   Diabetes mellitus without complication (HCC)    No meds-Last A1C in 2020 was 5.4   Elevated liver enzymes    Family history of adverse reaction to anesthesia    PTS TWIN BROTHER-HARD TIME TO WAKE UP   Hyperlipidemia    Hypertension    off meds x 3-4 years   Morbid obesity (HCC)    Normocytic normochromic anemia    Obesity     Past Surgical History:  Procedure Laterality Date   KNEE ARTHROSCOPY Left 02/16/2016   Procedure: ARTHROSCOPY KNEE, LATERAL RELEASE, LOOSE BODY REMOVAL;  Surgeon: Ozell Flake, MD;  Location: [facility] ORS;  Service: Orthopedics;  Laterality: Left;   KNEE ARTHROSCOPY WITH MEDIAL PATELLAR FEMORAL LIGAMENT RECONSTRUCTION Left 02/19/2021   Procedure: Left knee arthroscopy and chondroplasty with loose body removal, MPFL reconstruction using allograft, and tibial tubercle osteotomy - RNFA needed;  Surgeon: Tobie Priest, MD;  Location: [facility] ORS;  Service: Orthopedics;  Laterality: Left;  RNFA needed   PULMONARY THROMBECTOMY N/A 09/23/2024   Procedure: PULMONARY THROMBECTOMY;  Surgeon: Jama Cordella MATSU, MD;  Location: [facility] INVASIVE CV LAB;  Service: Cardiovascular;  Laterality: N/A;    Social History Social History[2]  Family History Family History  Problem Relation Age of Onset   Hypertension Mother     Allergies[3]   REVIEW OF SYSTEMS (Negative unless checked)  Constitutional: [] Weight loss  [] Fever  [] Chills Cardiac: [] Chest pain   [] Chest pressure   []   Palpitations   [] Shortness of breath when laying flat   [] Shortness of breath with exertion. Vascular:  [] Pain in legs with walking   [x] Pain in legs at rest  [] History of DVT   [] Phlebitis   [x] Swelling in legs   [] Varicose veins   [] Non-healing ulcers Pulmonary:   [] Uses home oxygen   [] Productive cough   [] Hemoptysis   [] Wheeze  [] COPD   [] Asthma Neurologic:  [] Dizziness   [] Seizures   [] History of stroke   [] History of TIA   [] Aphasia   [] Vissual changes   [] Weakness or numbness in arm   [] Weakness or numbness in leg Musculoskeletal:   [] Joint swelling   [] Joint pain   [] Low back pain Hematologic:  [] Easy bruising  [] Easy bleeding   [] Hypercoagulable state   [] Anemic Gastrointestinal:  [] Diarrhea   [] Vomiting  [] Gastroesophageal reflux/heartburn   [] Difficulty swallowing. Genitourinary:  [] Chronic kidney disease   [] Difficult urination  [] Frequent urination   [] Blood in urine Skin:  [] Rashes   [] Ulcers  Psychological:  [] History of anxiety   []  History of major depression.  Physical Examination  Vitals:   10/11/24 0852  BP: (!) 131/90  Pulse: 69  Resp: 18  Weight: (!) 455 lb 6.4 oz (206.6 kg)  Height: 6' 3 (1.905 m)   Body mass index is 56.92 kg/m. Gen: WD/WN, NAD Head: [place]/AT, No temporalis wasting.  Ear/Nose/Throat: Hearing grossly intact, nares w/o erythema or drainage, pinna without lesions Eyes: PER, EOMI, sclera nonicteric.  Neck: Supple, no gross masses.  No JVD.  Pulmonary:  Good air movement, no audible wheezing, no use of accessory muscles.  Cardiac: RRR, precordium not hyperdynamic. Vascular:  scattered varicosities present bilaterally.  Moderate venous stasis changes to the legs bilaterally.  2+ soft pitting edema. CEAP [id number]   Vessel Right Left  Radial Palpable Palpable  Gastrointestinal: soft, non-distended. No guarding/no peritoneal signs.  Musculoskeletal: M/S 5/5 throughout.  No deformity.  Neurologic: CN 2-12 intact. Pain and light touch intact in extremities.  Symmetrical.  Speech is fluent. Motor exam as listed above. Psychiatric: Judgment intact, Mood & affect appropriate for pt's clinical situation. Dermatologic: Venous rashes no ulcers noted.  No changes consistent with cellulitis. Lymph : No lichenification or skin changes of chronic lymphedema.  CBC Lab Results  Component Value Date   WBC 9.9 09/25/2024   HGB 14.2 09/25/2024   HCT 43.3 09/25/2024   MCV 82.5  09/25/2024   PLT 162 09/25/2024    BMET    Component Value Date/Time   NA 137 09/24/2024 0342   K 4.2 09/24/2024 0342   CL 102 09/24/2024 0342   CO2 23 09/24/2024 0342   GLUCOSE 87 09/24/2024 0342   BUN 13 09/24/2024 0342   CREATININE 0.77 09/24/2024 0342   CALCIUM 8.9 09/24/2024 0342   GFRNONAA >60 09/24/2024 0342   Estimated Creatinine Clearance: 268.5 mL/min (by C-G formula based on SCr of 0.77 mg/dL).  COAG Lab Results  Component Value Date   INR 1.1 09/23/2024    Radiology The radiology data is available in epic it cannot be displayed with this note because the radiologist used asterisks when they create addendum's and epic sees these as a smart phrase   Assessment/Plan 1. Acute saddle pulmonary embolism, unspecified whether acute cor pulmonale present (HCC) (Primary) Recommend:   No surgery or intervention at this point in time.  IVC filter is not indicated at present.  Patient's status post successful pulmonary thrombectomy.  The patient is on anticoagulation, he is currently taking  Eliquis  5 mg twice daily  Elevation was stressed, use of a recliner was discussed.  I have reviewed with the patient PE and the follow-up.  Patient is seeing hematology and they already have arranged for hypercoagulable workup in April 2026.  They are also helping him with his Eliquis .  The patient will continue anticoagulation for now as there have not been any problems or complications from anticoagulation therapy at this point.  The patient will follow-up with me as needed.   2. Acute deep vein thrombosis (DVT) of calf muscle vein of left lower extremity (HCC) Recommend:   No surgery or intervention at this point in time.  IVC filter is not indicated at present.  The patient's left leg is doing well and has minimal symptoms.  The patient is on anticoagulation   Elevation was stressed, use of a recliner was discussed.  I have reviewed with the patient DVT and post  phlebitic changes such as swelling and why it  causes symptoms such as pain.  I recommended to the patient to wear graduated compression stockings, beginning after three full days of anticoagulation.  Graduated compression should be worn on a daily basis. The patient should wear compression beginning first thing in the morning and removing them in the evening. The patient is instructed specifically not to sleep in the stockings.  In addition, behavioral modification including elevation during the day and avoidance of prolonged dependency will be initiated.    The patient will continue anticoagulation for now as there have not been any problems or complications from anticoagulation therapy at this point.  The patient will follow-up with me as needed.   3. Diabetes mellitus without complication (HCC) Continue hypoglycemic medications as already ordered, these medications have been reviewed and there are no changes at this time.  Hgb A1C to be monitored as already arranged by primary service  4. Community acquired pneumonia of left lower lobe of lung Patient was seen at [facility] and is completing his treatment.    Cordella Shawl, MD  10/11/2024 9:12 AM      [1]  Current Meds  Medication Sig   acetaminophen  (TYLENOL ) 325 MG tablet Take 2 tablets (650 mg total) by mouth every 6 (six) hours as needed for mild pain (pain score 1-3) or moderate pain (pain score 4-6).   [START ON 10/26/2024] apixaban  (ELIQUIS ) 5 MG TABS tablet Take 1 tablet (5 mg total) by mouth 2 (two) times daily.   APIXABAN  (ELIQUIS ) VTE STARTER PACK (10MG  AND 5MG ) Take as directed on package: start with two-5mg  tablets twice daily for 7 days. On day 8, switch to one-5mg  tablet twice daily.   menthol (CEPACOL) 3 MG lozenge Take 1 lozenge by mouth as needed for sore throat.   oxyCODONE -acetaminophen  (PERCOCET) 5-325 MG tablet Take 1-2 tablets by mouth every 6 (six) hours as needed for severe pain (pain score 7-10).  [2]  Social  History Tobacco Use   Smoking status: Never   Smokeless tobacco: Never  Vaping Use   Vaping status: Never Used  Substance Use Topics   Alcohol use: No   Drug use: No  [3] No Known Allergies  "

## 2024-10-12 ENCOUNTER — Encounter (INDEPENDENT_AMBULATORY_CARE_PROVIDER_SITE_OTHER): Payer: Self-pay | Admitting: Vascular Surgery

## 2024-10-12 ENCOUNTER — Telehealth (INDEPENDENT_AMBULATORY_CARE_PROVIDER_SITE_OTHER): Payer: Self-pay

## 2024-10-12 NOTE — Telephone Encounter (Signed)
 Spoke to patient at this time and made him aware his short-term disability paperwork did come through and is on the providers desk at this time. I also left a note to make provider aware he does lift 50+ lbs and stands 8+ hours with a lot of walking per patient.

## 2024-10-18 ENCOUNTER — Telehealth (INDEPENDENT_AMBULATORY_CARE_PROVIDER_SITE_OTHER): Payer: Self-pay

## 2024-10-18 NOTE — Telephone Encounter (Signed)
 Patient called at this time in reference to [facility] paperwork that had been faxed over and had a few other questions at this time. Spoke with patient and he was wondering if he can return back to work Monday January 5 without limitations, full time, and full duty? Please advise.   Spoke to patient at this time and made him aware his short-term disability paperwork did come through and is on the providers desk at this time. I also left a note to make provider aware he does lift 50+ lbs and stands 8+ hours with a lot of walking per patient.

## 2024-10-25 ENCOUNTER — Encounter (INDEPENDENT_AMBULATORY_CARE_PROVIDER_SITE_OTHER): Payer: Self-pay | Admitting: Nurse Practitioner

## 2024-10-25 ENCOUNTER — Telehealth (INDEPENDENT_AMBULATORY_CARE_PROVIDER_SITE_OTHER): Payer: Self-pay

## 2024-10-25 NOTE — Telephone Encounter (Signed)
 Patient left a message requesting for work note to return to work on tomorrow. Please Advise

## 2024-10-25 NOTE — Telephone Encounter (Signed)
 Pt came in office and pt was given work note to return tomorrow signed by Delores Pickles, NP.
